# Patient Record
Sex: Female | Born: 1960 | Hispanic: No | Marital: Married | Smoking: Never smoker
Health system: Southern US, Community
[De-identification: ages and names within clinical notes are randomized; demographics above are authoritative.]

## PROBLEM LIST (undated history)

## (undated) DIAGNOSIS — F419 Anxiety disorder, unspecified: Secondary | ICD-10-CM

## (undated) DIAGNOSIS — R112 Nausea with vomiting, unspecified: Secondary | ICD-10-CM

## (undated) DIAGNOSIS — Z9889 Other specified postprocedural states: Secondary | ICD-10-CM

## (undated) DIAGNOSIS — E220 Acromegaly and pituitary gigantism: Secondary | ICD-10-CM

## (undated) DIAGNOSIS — G4733 Obstructive sleep apnea (adult) (pediatric): Secondary | ICD-10-CM

## (undated) DIAGNOSIS — M1990 Unspecified osteoarthritis, unspecified site: Secondary | ICD-10-CM

## (undated) DIAGNOSIS — T8859XA Other complications of anesthesia, initial encounter: Secondary | ICD-10-CM

## (undated) DIAGNOSIS — K219 Gastro-esophageal reflux disease without esophagitis: Secondary | ICD-10-CM

## (undated) DIAGNOSIS — T4145XA Adverse effect of unspecified anesthetic, initial encounter: Secondary | ICD-10-CM

## (undated) HISTORY — DX: Acromegaly and pituitary gigantism: E22.0

## (undated) HISTORY — DX: Obstructive sleep apnea (adult) (pediatric): G47.33

## (undated) HISTORY — PX: OTHER SURGICAL HISTORY: SHX169

---

## 2007-06-08 HISTORY — PX: HEMORRHOID SURGERY: SHX153

## 2008-04-06 HISTORY — PX: OTHER SURGICAL HISTORY: SHX169

## 2010-11-07 HISTORY — PX: OTHER SURGICAL HISTORY: SHX169

## 2010-12-11 ENCOUNTER — Encounter (HOSPITAL_COMMUNITY)
Admission: RE | Admit: 2010-12-11 | Discharge: 2010-12-11 | Disposition: A | Discharge status: Home / Self Care | Payer: Managed Care, Other (non HMO) | Source: Ambulatory Visit | Attending: Orthopedic Surgery | Admitting: Orthopedic Surgery

## 2010-12-11 DIAGNOSIS — Z01812 Encounter for preprocedural laboratory examination: Secondary | ICD-10-CM | POA: Insufficient documentation

## 2010-12-11 LAB — CBC
HCT: 43.4 % (ref 36.0–46.0)
Hemoglobin: 14.6 g/dL (ref 12.0–15.0)
MCH: 32.1 pg (ref 26.0–34.0)
MCHC: 33.6 g/dL (ref 30.0–36.0)
MCV: 95.4 fL (ref 78.0–100.0)
Platelets: 258 10*3/uL (ref 150–400)
RBC: 4.55 MIL/uL (ref 3.87–5.11)
RDW: 13.6 % (ref 11.5–15.5)
WBC: 7.1 10*3/uL (ref 4.0–10.5)

## 2010-12-11 LAB — COMPREHENSIVE METABOLIC PANEL
ALT: 23 U/L (ref 0–35)
AST: 18 U/L (ref 0–37)
Albumin: 4 g/dL (ref 3.5–5.2)
Alkaline Phosphatase: 58 U/L (ref 39–117)
BUN: 9 mg/dL (ref 6–23)
CO2: 32 mEq/L (ref 19–32)
Calcium: 10 mg/dL (ref 8.4–10.5)
Chloride: 103 mEq/L (ref 96–112)
Creatinine, Ser: 0.7 mg/dL (ref 0.4–1.2)
GFR calc Af Amer: >60 mL/min (ref >60)
GFR calc non Af Amer: >60 mL/min (ref >60)
Glucose, Bld: 108 mg/dL — ABNORMAL HIGH (ref 70–99)
Potassium: 4.3 mEq/L (ref 3.5–5.1)
Sodium: 141 mEq/L (ref 135–145)
Total Bilirubin: 0.7 mg/dL (ref 0.3–1.2)
Total Protein: 7 g/dL (ref 6.0–8.3)

## 2010-12-11 LAB — URINALYSIS, ROUTINE W REFLEX MICROSCOPIC
Bilirubin Urine: NEGATIVE
Glucose, UA: NEGATIVE mg/dL
Ketones, ur: NEGATIVE mg/dL
Nitrite: NEGATIVE
Protein, ur: NEGATIVE mg/dL
Specific Gravity, Urine: 1.012 (ref 1.005–1.030)
Urobilinogen, UA: 0.2 mg/dL (ref 0.0–1.0)
pH: 6.5 (ref 5.0–8.0)

## 2010-12-11 LAB — PROTIME-INR
INR: 0.86 (ref 0.00–1.49)
Prothrombin Time: 11.9 seconds (ref 11.6–15.2)

## 2010-12-11 LAB — URINE MICROSCOPIC-ADD ON

## 2010-12-11 LAB — SURGICAL PCR SCREEN
MRSA, PCR: NEGATIVE
Staphylococcus aureus: NEGATIVE

## 2010-12-11 LAB — APTT: aPTT: 26 seconds (ref 24–37)

## 2010-12-13 ENCOUNTER — Observation Stay (HOSPITAL_COMMUNITY)
Admission: RE | Admit: 2010-12-13 | Discharge: 2010-12-14 | Disposition: A | Discharge status: Home / Self Care | Payer: Managed Care, Other (non HMO) | Source: Ambulatory Visit | Attending: Orthopedic Surgery | Admitting: Orthopedic Surgery

## 2010-12-13 DIAGNOSIS — M24119 Other articular cartilage disorders, unspecified shoulder: Secondary | ICD-10-CM | POA: Insufficient documentation

## 2010-12-13 DIAGNOSIS — M719 Bursopathy, unspecified: Secondary | ICD-10-CM | POA: Insufficient documentation

## 2010-12-13 DIAGNOSIS — M19019 Primary osteoarthritis, unspecified shoulder: Secondary | ICD-10-CM | POA: Insufficient documentation

## 2010-12-13 DIAGNOSIS — M25819 Other specified joint disorders, unspecified shoulder: Principal | ICD-10-CM | POA: Insufficient documentation

## 2010-12-13 DIAGNOSIS — M67919 Unspecified disorder of synovium and tendon, unspecified shoulder: Secondary | ICD-10-CM | POA: Insufficient documentation

## 2011-01-07 NOTE — Op Note (Signed)
NAMECHEYAN, [name]        ACCOUNT NO.:  [id number]  MEDICAL RECORD NO.:  [id number]           PATIENT TYPE:  I  LOCATION:  5015                         FACILITY:  [facility]  PHYSICIAN:  Vania Rea. Shelley Pooley, M.D.  DATE OF BIRTH:  [birth year]  DATE OF PROCEDURE:  12/13/2010 DATE OF DISCHARGE:                              OPERATIVE REPORT   PREOPERATIVE DIAGNOSES: 1. Right shoulder impingement syndrome. 2. Right shoulder acromioclavicular joint arthrosis. 3. Early right shoulder glenohumeral arthrosis.  POSTOPERATIVE DIAGNOSES: 1. Right shoulder impingement syndrome. 2. Right shoulder acromioclavicular joint arthrosis. 3. Early right shoulder glenohumeral arthrosis.  PROCEDURE: 1. Right shoulder diagnostic arthroscopy. 2. Chondroplasty of the humeral head. 3. Debridement of degenerative labral tear. 4. Synovectomy of glenohumeral joint. 5. Debridement of partial articular rotator cuff tear. 6. Arthroscopic subacromial decompression and bursectomy. 7. Arthroscopic distal clavicle resection.  SURGEON:  Vania Rea. Marcell Chavarin, MD  ASSISTANT:  Lucita Lora. [place], PA-C  ANESTHESIA:  General endotracheal as well as an interscalene block.  ESTIMATED BLOOD LOSS:  Minimal.  DRAINS:  None.  HISTORY:  [name] is a 50 year old female who has had persistent and progressive increasing right shoulder pain with ongoing functional limitations and symptoms that have been refractory to prolonged attempts at conservative management.  Due to her ongoing pain and functional limitations, she is brought to the operating room at this time for planned right shoulder arthroscopy as described below.  We counseled [name] on treatment options as well as risks versus benefits thereof.  Possible surgical complications were reviewed including the potential for bleeding, infection, neurovascular injury, persistent pain, loss of motion, anesthetic complication, possible need for additional  surgery.  She understands and accepts and agrees with our planned procedure.  PROCEDURE IN DETAIL:  After undergoing routine preop evaluation, the patient received prophylactic antibiotics.  An interscalene block was established in the holding area but the Anesthesia Department.  Placed supine on the operating table, underwent smooth induction of general endotracheal anesthesia.  Turned to left lateral decubitus position on beanbag and appropriately padded and protected.  Right shoulder was then suspended at 70 degrees of abduction with 10 pounds of traction.  The right shoulder girdle region was sterilely prepped and draped in standard fashion.  Time-out was called.  Posterior portal was established at the glenohumeral joint and the anterior portal was established under direct visualization.  The overall capsular volume did not suggest adhesive capsulitis and if anything her shoulder volume was the upper limits of normal.  There was marked degenerative change of the articular cartilage on the humeral head with multiple blistered areas and chondral fissuring and a shaver was introduced and used to perform a chondroplasty of all loose friable cartilage.  There was degenerative tearing of this anterior and superior labrum which was debrided with a shaver to a stable margin.  There was significant synovitis as well and synovectomy was performed and the Stryker wand was then used to obtain hemostasis.  Biceps anchor was stable.  Biceps tendon showed normal caliber.  There was a partial articular rotator cuff tear involving the distal supraspinatus.  This was debrided with a shaver to a stable margin with estimated  accounted for approximately 10% to 15% of the thickness of the tendon.  No additional pathologies were identified. Fluid and instruments were removed.  The arm was dropped down to 30 degrees of abduction.  The arthroscope entered the subacromial space to the posterior portal and  direct lateral portal was established in the subacromial space.  Abundant proliferative bursal tissue was encountered and this was divided and excised with combination shaver and the Stryker wand.  Wand was then used to remove the periosteum from the undersurface of the anterior half of the acromion.  The subacromial depression was performed with a bur creating a type 1 morphology portal.  Portal was then established directly anterior to the distal clavicle and the distal clavicle resection performed with the bur.  Care was taken to make sure the entire circumference of the distal clavicle could be visualized to ensure adequate removal of the bone.  We then completed the subacromial/subdeltoid bursectomy.  The bursal surface and rotator cuff was carefully inspected and found to be intact.  Hemostasis was obtained.  Fluid and instruments were removed.  The portal was closed with Monocryl and Steri-Strips.  Bulky dry dressing taped of the right shoulder.  Right arm was placed in a sling immobilizer.  The patient was awakened, extubated, taken to recovery room in stable condition.     Vania Rea. [place], M.D.     KMS/MEDQ  D:  12/13/2010  T:  12/14/2010  Job:  [id number]  Electronically Signed by Francena Hanly M.D. on 01/07/2011 12:50:40 PM

## 2011-10-08 DIAGNOSIS — E220 Acromegaly and pituitary gigantism: Secondary | ICD-10-CM

## 2011-10-08 HISTORY — PX: PITUITARY SURGERY: SHX203

## 2011-10-08 HISTORY — DX: Acromegaly and pituitary gigantism: E22.0

## 2012-03-05 ENCOUNTER — Other Ambulatory Visit: Payer: Self-pay | Admitting: Sports Medicine

## 2012-03-05 ENCOUNTER — Ambulatory Visit
Admission: RE | Admit: 2012-03-05 | Discharge: 2012-03-05 | Disposition: A | Discharge status: Home / Self Care | Payer: Managed Care, Other (non HMO) | Source: Ambulatory Visit | Attending: Sports Medicine | Admitting: Sports Medicine

## 2012-03-05 DIAGNOSIS — D497 Neoplasm of unspecified behavior of endocrine glands and other parts of nervous system: Secondary | ICD-10-CM

## 2012-03-05 MED ORDER — GADOBENATE DIMEGLUMINE 529 MG/ML IV SOLN
7.0000 mL | Freq: Once | INTRAVENOUS | Status: AC | PRN
  Administered 2012-03-05: 7 mL via INTRAVENOUS

## 2012-03-31 ENCOUNTER — Encounter: Payer: Self-pay | Admitting: Pulmonary Disease

## 2012-04-01 ENCOUNTER — Encounter: Payer: Self-pay | Admitting: Internal Medicine

## 2012-04-01 ENCOUNTER — Ambulatory Visit (INDEPENDENT_AMBULATORY_CARE_PROVIDER_SITE_OTHER): Payer: Managed Care, Other (non HMO) | Admitting: Internal Medicine

## 2012-04-01 VITALS — BP 136/82 | HR 103 | Ht 62.0 in | Wt 169.4 lb

## 2012-04-01 DIAGNOSIS — G4733 Obstructive sleep apnea (adult) (pediatric): Secondary | ICD-10-CM

## 2012-04-01 DIAGNOSIS — D353 Benign neoplasm of craniopharyngeal duct: Secondary | ICD-10-CM

## 2012-04-01 DIAGNOSIS — D352 Benign neoplasm of pituitary gland: Secondary | ICD-10-CM | POA: Insufficient documentation

## 2012-04-01 DIAGNOSIS — E220 Acromegaly and pituitary gigantism: Secondary | ICD-10-CM | POA: Insufficient documentation

## 2012-04-01 NOTE — Assessment & Plan Note (Signed)
Surgery to address her pituitary adenoma is planned through a sphenoid approach with requirement that she be off of CPAP for 2 weeks postoperatively. The primary requirement is that her anesthesiologist, surgeon and the nursing staff be aware of her obstructive sleep apnea, which will be aggravated by sedation and analgesics, her thick tongue and nasal packing. She may require some prolongation of endotracheal intubation, at least until fully awake in recovery.  Recommend: Sleep sitting upright; continuous monitoring for oximetry, respiratory rate and heart rhythm; local pulmonary/sleep medicine consultation may be helpful.  She and her husband have good insight, recognizing this is necessary surgery and that she will inevitably be at increased risk of complications. She is going to return here for outpatient pulmonary/sleep medicine followup postoperatively. We can reassess at that time.

## 2012-04-01 NOTE — Progress Notes (Signed)
04/01/12- 30 yoF never smoker referred by Dr Altheimer for preoperative evaluation. Husband here.  She has a pituitary adenoma with acromegaly. She has been referred for neurosurgery at [facility].  She was diagnosed with obstructive sleep apnea at [facility] based on NPSG 1021 and 11: AHI 41 per hour. She was severe in all positions, worse supine. Body weight was 146 pounds. CPAP titration 08/23/2010: 10 CWP with good control. She has been using CPAP with excellent control and compliance and firm by her husband who says she has not snored through the mask. She has been using CPAP 10/ nasaal pillows/ [employer] [facility]. Typical bedtime 9 PM with short sleep latency, waking 2 or 3 times during the night before finally up between 6:30 and 7:30 AM. Pain in the neck and shoulders has been waking her, resulting in increased daytime sleepiness. Especially over the past year, she has gained 20 pounds and has noted thickening in her tongue, coarsening of features and enlargement of the hands and feet. No history of ENT surgery. She had outpatient surgeries in the last 2 years which did not require her to stay off of CPAP. She denies history of cardiopulmonary disease. Seasonal allergic rhinitis, and in the spring. Nasal congestion, thickening of the tongue and weight gain had not so far required CPAP pressure change.  Prior to Admission medications   Medication Sig Start Date End Date Taking? Authorizing Provider  Ascorbic Acid (VITAMIN C) 1000 MG tablet Take 1,000 mg by mouth daily.   Yes Historical Provider, MD  Cholecalciferol 5000 UNITS capsule Take 5,000 Units by mouth daily.   Yes Historical Provider, MD  Chromium 200 MCG CAPS Take by mouth 2 (two) times daily.   Yes Historical Provider, MD  fish oil-omega-3 fatty acids 1000 MG capsule Take by mouth daily.   Yes Historical Provider, MD  LORazepam (ATIVAN) 1 MG tablet Take 1 mg by mouth. 1-2 times a day   Yes Historical Provider, MD  Lysine 500 MG CAPS Take by mouth. 1-2  times a day   Yes Historical Provider, MD  Magnesium 400 MG CAPS Take by mouth. 1 capsule with a meal once a day   Yes Historical Provider, MD  Milk Thistle 200 MG CAPS Take by mouth 2 (two) times daily.   Yes Historical Provider, MD  pyridOXINE (VITAMIN B-6) 100 MG tablet Take 100 mg by mouth 3 (three) times daily.   Yes Historical Provider, MD  TIZANIDINE HCL PO Take 2 mg by mouth at bedtime as needed.   Yes Historical Provider, MD  vitamin B-12 (CYANOCOBALAMIN) 1000 MCG tablet Take 1,000 mcg by mouth daily.   Yes Historical Provider, MD  zolpidem (AMBIEN) 10 MG tablet Take 10 mg by mouth at bedtime as needed.   Yes Historical Provider, MD   Past Medical History  Diagnosis Date  . Degenerative disc disease   . OSA (obstructive sleep apnea)   . 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 Degenerative disk disease    Past Surgical History  Procedure Date  . Elbow ligament surgery right remote   . Bladder suspension, unspecified   . Hemorrhoid surgery 06/2007  . Appendectomy 04/2008  . Burnionectomy, bilateral   . Rotator cuff repair right shoulder 11/2010  . Nerve decompression right c5 and nerve ablation c5 and c6    Family History  Problem Relation Age of Onset  . Heart disease Father   . Coronary artery disease Father   . Heart disease Mother   .  Coronary artery disease Mother   . Sleep apnea Sister    History   Social History  . Marital Status: Married    Spouse Name: N/A    Number of Children: 1  . Years of Education: N/A   Occupational History  . Not on file.   Social History Main Topics  . Smoking status: Never Smoker   . Smokeless tobacco: Not on file  . Alcohol Use: Yes     socially  . Drug Use: No  . Sexually Active: Not on file   Other Topics Concern  . Not on file   Social History Narrative  . No narrative on file   ROS-see HPI Constitutional:   No-   weight loss, night sweats, fevers, chills, fatigue, lassitude. HEENT:   + headaches, No-difficulty  swallowing, tooth/dental problems, sore throat,       No-  sneezing, itching, ear ache, nasal congestion, post nasal drip, ears feel stopped up CV:  No-   chest pain, orthopnea, PND, swelling in lower extremities, anasarca, dizziness, palpitations Resp: No-   shortness of breath with exertion or at rest.              No-   productive cough,  No non-productive cough,  No- coughing up of blood.              No-   change in color of mucus.  No- wheezing.   Skin: No-   rash or lesions. GI:  No-   heartburn, indigestion, abdominal pain, nausea, vomiting, diarrhea,                 change in bowel habits, loss of appetite GU: No-   dysuria, change in color of urine, no urgency or frequency.  No- flank pain. MS:  +  joint pain or swelling.  No- decreased range of motion.  + back pain. Neuro-     nothing unusual Psych:  No- change in mood or affect. + Situational depression or anxiety.  No memory loss.  OBJ- Physical Exam BP 136/82  Pulse 103  Ht 5\' 2"  (1.575 m)  Wt 169 lb 6.4 oz (76.839 kg)  BMI 30.98 kg/m2  SpO2 100%  General- Alert, Oriented, Affect-appropriate, Distress- none acute, coarse features with large hands and feet Skin- rash-none, lesions- none, excoriation- none Lymphadenopathy- none Head- atraumatic            Eyes- Gross vision intact, PERRLA, conjunctivae and secretions clear, bags under her eyes            Ears- Hearing, canals-filled with cerumen bilaterally            Nose- Clear, no-Septal dev, mucus, polyps, erosion, perforation             Throat- Mallampati III , mucosa clear , drainage- none, tonsils- atrophic Neck- flexible , trachea midline, no stridor , thyroid nl, carotid no bruit Chest - symmetrical excursion , unlabored           Heart/CV- RRR , no murmur , no gallop  , no rub, nl s1 s2                           - JVD- none , edema- none, stasis changes- none, varices- none           Lung- clear to P&A, wheeze- none, cough- none , dullness-none, rub- none  Chest wall-  Abd- tender-no, distended-no, bowel sounds-present, HSM- no Br/ Gen/ Rectal- Not done, not indicated Extrem- cyanosis- none, clubbing, none, atrophy- none, strength- nl Neuro- grossly intact to observation

## 2012-04-01 NOTE — Patient Instructions (Addendum)
I will have office note ready for you to pick up tomorrow

## 2012-04-29 DIAGNOSIS — I10 Essential (primary) hypertension: Secondary | ICD-10-CM | POA: Insufficient documentation

## 2012-04-29 DIAGNOSIS — E236 Other disorders of pituitary gland: Secondary | ICD-10-CM | POA: Insufficient documentation

## 2012-05-05 ENCOUNTER — Institutional Professional Consult (permissible substitution): Payer: Managed Care, Other (non HMO) | Admitting: Pulmonary Disease

## 2012-05-07 ENCOUNTER — Inpatient Hospital Stay (HOSPITAL_COMMUNITY)
Admission: EM | Admit: 2012-05-07 | Discharge: 2012-05-09 | DRG: 644 | Disposition: A | Discharge status: Home / Self Care | Payer: Managed Care, Other (non HMO) | Attending: Internal Medicine | Admitting: Internal Medicine

## 2012-05-07 ENCOUNTER — Encounter (HOSPITAL_COMMUNITY): Payer: Self-pay | Admitting: Emergency Medicine

## 2012-05-07 DIAGNOSIS — R5381 Other malaise: Secondary | ICD-10-CM

## 2012-05-07 DIAGNOSIS — E236 Other disorders of pituitary gland: Principal | ICD-10-CM | POA: Diagnosis present

## 2012-05-07 DIAGNOSIS — Z79899 Other long term (current) drug therapy: Secondary | ICD-10-CM

## 2012-05-07 DIAGNOSIS — R531 Weakness: Secondary | ICD-10-CM

## 2012-05-07 DIAGNOSIS — D352 Benign neoplasm of pituitary gland: Secondary | ICD-10-CM | POA: Diagnosis present

## 2012-05-07 DIAGNOSIS — E860 Dehydration: Secondary | ICD-10-CM | POA: Diagnosis present

## 2012-05-07 DIAGNOSIS — E871 Hypo-osmolality and hyponatremia: Secondary | ICD-10-CM | POA: Diagnosis present

## 2012-05-07 DIAGNOSIS — R5383 Other fatigue: Secondary | ICD-10-CM

## 2012-05-07 DIAGNOSIS — N390 Urinary tract infection, site not specified: Secondary | ICD-10-CM | POA: Diagnosis present

## 2012-05-07 DIAGNOSIS — E220 Acromegaly and pituitary gigantism: Secondary | ICD-10-CM

## 2012-05-07 DIAGNOSIS — G4733 Obstructive sleep apnea (adult) (pediatric): Secondary | ICD-10-CM | POA: Diagnosis present

## 2012-05-07 DIAGNOSIS — R112 Nausea with vomiting, unspecified: Secondary | ICD-10-CM | POA: Diagnosis present

## 2012-05-07 LAB — CBC WITH DIFFERENTIAL/PLATELET
Basophils Absolute: 0 10*3/uL (ref 0.0–0.1)
Basophils Relative: 0 % (ref 0–1)
Eosinophils Absolute: 0.2 10*3/uL (ref 0.0–0.7)
Eosinophils Relative: 2 % (ref 0–5)
HCT: 38.3 % (ref 36.0–46.0)
Hemoglobin: 13.4 g/dL (ref 12.0–15.0)
Lymphocytes Relative: 33 % (ref 12–46)
Lymphs Abs: 2.9 10*3/uL (ref 0.7–4.0)
MCH: 31.7 pg (ref 26.0–34.0)
MCHC: 35 g/dL (ref 30.0–36.0)
MCV: 90.5 fL (ref 78.0–100.0)
Monocytes Absolute: 0.5 10*3/uL (ref 0.1–1.0)
Monocytes Relative: 5 % (ref 3–12)
Neutro Abs: 5.2 10*3/uL (ref 1.7–7.7)
Neutrophils Relative %: 60 % (ref 43–77)
Platelets: 338 10*3/uL (ref 150–400)
RBC: 4.23 MIL/uL (ref 3.87–5.11)
RDW: 12.9 % (ref 11.5–15.5)
WBC: 8.7 10*3/uL (ref 4.0–10.5)

## 2012-05-07 LAB — URINALYSIS, ROUTINE W REFLEX MICROSCOPIC
Bilirubin Urine: NEGATIVE
Glucose, UA: NEGATIVE mg/dL
Ketones, ur: NEGATIVE mg/dL
Nitrite: NEGATIVE
Protein, ur: NEGATIVE mg/dL
Specific Gravity, Urine: 1.007 (ref 1.005–1.030)
Urobilinogen, UA: 0.2 mg/dL (ref 0.0–1.0)
pH: 7.5 (ref 5.0–8.0)

## 2012-05-07 LAB — BASIC METABOLIC PANEL
BUN: 14 mg/dL (ref 6–23)
CO2: 28 mEq/L (ref 19–32)
Calcium: 9.3 mg/dL (ref 8.4–10.5)
Chloride: 86 mEq/L — ABNORMAL LOW (ref 96–112)
Creatinine, Ser: 0.59 mg/dL (ref 0.50–1.10)
GFR calc Af Amer: >90 mL/min (ref >90)
GFR calc non Af Amer: >90 mL/min (ref >90)
Glucose, Bld: 107 mg/dL — ABNORMAL HIGH (ref 70–99)
Potassium: 3.7 mEq/L (ref 3.5–5.1)
Sodium: 125 mEq/L — ABNORMAL LOW (ref 135–145)

## 2012-05-07 LAB — PRO B NATRIURETIC PEPTIDE: Pro B Natriuretic peptide (BNP): 86.1 pg/mL (ref 0–125)

## 2012-05-07 LAB — URINE MICROSCOPIC-ADD ON

## 2012-05-07 LAB — PHOSPHORUS: Phosphorus: 3.5 mg/dL (ref 2.3–4.6)

## 2012-05-07 LAB — MAGNESIUM: Magnesium: 2.1 mg/dL (ref 1.5–2.5)

## 2012-05-07 MED ORDER — SODIUM CHLORIDE 0.9 % IV SOLN: 250.0000 mL | INTRAVENOUS | Status: DC | PRN

## 2012-05-07 MED ORDER — HYDROCORTISONE SOD SUCCINATE 100 MG IJ SOLR
100.0000 mg | Freq: Once | INTRAMUSCULAR | Status: AC
  Administered 2012-05-07: 100 mg via INTRAVENOUS
  Filled 2012-05-07: qty 2

## 2012-05-07 MED ORDER — ONDANSETRON HCL 4 MG PO TABS: 4.0000 mg | ORAL_TABLET | Freq: Four times a day (QID) | ORAL | Status: DC | PRN

## 2012-05-07 MED ORDER — ONDANSETRON HCL 4 MG/2ML IJ SOLN: 4.0000 mg | Freq: Four times a day (QID) | INTRAMUSCULAR | Status: DC | PRN

## 2012-05-07 MED ORDER — TIZANIDINE HCL 2 MG PO TABS
2.0000 mg | ORAL_TABLET | Freq: Every day | ORAL | Status: DC
  Filled 2012-05-07 (×3): qty 1

## 2012-05-07 MED ORDER — ACETAMINOPHEN 650 MG RE SUPP: 650.0000 mg | Freq: Four times a day (QID) | RECTAL | Status: DC | PRN

## 2012-05-07 MED ORDER — SODIUM CHLORIDE 0.9 % IJ SOLN
3.0000 mL | Freq: Two times a day (BID) | INTRAMUSCULAR | Status: DC
  Administered 2012-05-07: 3 mL via INTRAVENOUS

## 2012-05-07 MED ORDER — SODIUM CHLORIDE 0.9 % IV BOLUS (SEPSIS)
1000.0000 mL | Freq: Once | INTRAVENOUS | Status: AC
  Administered 2012-05-07: 1000 mL via INTRAVENOUS

## 2012-05-07 MED ORDER — ONDANSETRON HCL 4 MG/2ML IJ SOLN
4.0000 mg | Freq: Once | INTRAMUSCULAR | Status: AC
  Administered 2012-05-07: 4 mg via INTRAVENOUS
  Filled 2012-05-07: qty 2

## 2012-05-07 MED ORDER — LORAZEPAM 1 MG PO TABS: 1.0000 mg | ORAL_TABLET | Freq: Two times a day (BID) | ORAL | Status: DC | PRN

## 2012-05-07 MED ORDER — DEXTROSE 5 % IV SOLN
1.0000 g | Freq: Once | INTRAVENOUS | Status: AC
  Administered 2012-05-07: 1 g via INTRAVENOUS
  Filled 2012-05-07: qty 10

## 2012-05-07 MED ORDER — ACETAMINOPHEN 325 MG PO TABS: 650.0000 mg | ORAL_TABLET | Freq: Four times a day (QID) | ORAL | Status: DC | PRN

## 2012-05-07 MED ORDER — SODIUM CHLORIDE 0.9 % IJ SOLN: 3.0000 mL | INTRAMUSCULAR | Status: DC | PRN

## 2012-05-07 MED ORDER — ZOLPIDEM TARTRATE 5 MG PO TABS
10.0000 mg | ORAL_TABLET | Freq: Every day | ORAL | Status: DC
  Administered 2012-05-07 – 2012-05-08 (×2): 10 mg via ORAL
  Filled 2012-05-07 (×2): qty 2

## 2012-05-07 MED ORDER — SODIUM CHLORIDE 0.9 % IJ SOLN: 3.0000 mL | Freq: Two times a day (BID) | INTRAMUSCULAR | Status: DC

## 2012-05-07 NOTE — ED Notes (Signed)
REPORT GIVEN TO FLOOR NURSE , TRANSPORTED TO FLOOR IN STABLE CONDITION . DENIES PAIN OR NAUSEA AT TIME OF TRANSPORT.

## 2012-05-07 NOTE — ED Notes (Signed)
Pt c/o generalized weakness and N/V; pt sts recent pituitary tumor removal; pt told to come to check sodium

## 2012-05-07 NOTE — ED Notes (Signed)
S/p pituitary gland tumor removal 7 days ago. C/o generalized weakness, nausea, emesis x1, since yesterday. Today c/o h/a posterior head, states has not experienced any h/a's since surgery. Denies abd pain, diarrhea. Decreased appetite & oral intake due to nausea.

## 2012-05-07 NOTE — Progress Notes (Signed)
Pt is put in her bed. Pt is comfortable and in no apparent distress.Pt was oriented to the unit and her room. Safety Video was viewed.

## 2012-05-07 NOTE — ED Notes (Signed)
PT. WAITING FOR ADMITTING MD.  

## 2012-05-07 NOTE — ED Provider Notes (Signed)
History     CSN: [id number]  Arrival date & time 05/07/12  1556   First MD Initiated Contact with Patient 05/07/12 1757      Chief Complaint  Patient presents with  . Emesis  . Fatigue    (Consider location/radiation/quality/duration/timing/severity/associated sxs/prior treatment) HPI Pt with pituitary adenoma removed several days ago at [facility] present with generalized weakness, nausea and mild HA. Decreased urinary output today though she states she has been drinking less. No fever, chills, SOB, CP, focal weakness, numbness or visual changes. No abd pain V/D.  Past Medical History  Diagnosis Date  . Degenerative disc disease   . OSA (obstructive sleep apnea)   . 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 Degenerative disk disease     Past Surgical History  Procedure Date  . Elbow ligament surgery right remote   . Bladder suspension, unspecified   . Hemorrhoid surgery 06/2007  . Appendectomy 04/2008  . Burnionectomy, bilateral   . Rotator cuff repair right shoulder 11/2010  . Nerve decompression right c5 and nerve ablation c5 and c6     Family History  Problem Relation Age of Onset  . Heart disease Father   . Coronary artery disease Father   . Heart disease Mother   . Coronary artery disease Mother   . Sleep apnea Sister     History  Substance Use Topics  . Smoking status: Never Smoker   . Smokeless tobacco: Not on file  . Alcohol Use: Yes     socially    OB History    Grav Para Term Preterm Abortions TAB SAB Ect Mult Living                  Review of Systems  Constitutional: Positive for fatigue. Negative for fever and chills.  HENT: Negative for neck pain and neck stiffness.   Eyes: Negative for visual disturbance.  Respiratory: Negative for cough and shortness of breath.   Cardiovascular: Negative for chest pain, palpitations and leg swelling.  Gastrointestinal: Positive for nausea. Negative for vomiting, abdominal pain, diarrhea and constipation.    Genitourinary: Negative for dysuria, frequency and flank pain.  Musculoskeletal: Negative for back pain.  Skin: Negative for rash and wound.  Neurological: Positive for headaches. Negative for dizziness, tremors, seizures, weakness, light-headedness and numbness.    Allergies  Epinephrine and Codeine  Home Medications   Current Outpatient Rx  Name Route Sig Dispense Refill  . LORAZEPAM 1 MG PO TABS Oral Take 1 mg by mouth 2 (two) times daily as needed. For anxiety    . TIZANIDINE HCL 2 MG PO CAPS Oral Take 2 mg by mouth at bedtime.    Marland Kitchen ZOLPIDEM TARTRATE 10 MG PO TABS Oral Take 10 mg by mouth at bedtime.       BP 152/80  Pulse 69  Temp 97.8 F (36.6 C) (Oral)  Resp 16  SpO2 99%  Physical Exam  Nursing note and vitals reviewed. Constitutional: She is oriented to person, place, and time. She appears well-developed and well-nourished. No distress.  HENT:  Head: Normocephalic and atraumatic.  Mouth/Throat: Oropharynx is clear and moist.  Eyes: EOM are normal. Pupils are equal, round, and reactive to light.  Neck: Normal range of motion. Neck supple.       No meningismus   Cardiovascular: Normal rate and regular rhythm.   Pulmonary/Chest: Effort normal and breath sounds normal. No respiratory distress. She has no wheezes. She has no rales.  Abdominal: Soft.  Bowel sounds are normal. There is no tenderness. There is no rebound and no guarding.  Musculoskeletal: Normal range of motion. She exhibits no edema and no tenderness.  Neurological: She is alert and oriented to person, place, and time.       5/5 motor, sensation intact  Skin: Skin is warm and dry. No rash noted. No erythema.  Psychiatric: She has a normal mood and affect. Her behavior is normal.    ED Course  Procedures (including critical care time)  Labs Reviewed  BASIC METABOLIC PANEL - Abnormal; Notable for the following:    Sodium 125 (*)     Chloride 86 (*)     Glucose, Bld 107 (*)     All other components  within normal limits  URINALYSIS, ROUTINE W REFLEX MICROSCOPIC - Abnormal; Notable for the following:    APPearance HAZY (*)     Hgb urine dipstick TRACE (*)     Leukocytes, UA LARGE (*)     All other components within normal limits  URINE MICROSCOPIC-ADD ON - Abnormal; Notable for the following:    Bacteria, UA FEW (*)     All other components within normal limits  CBC WITH DIFFERENTIAL  CORTISOL  URINE CULTURE   No results found.   1. Hyponatremia   2. Generalized weakness       MDM  Discussed with Dr Roseanne Reno, Endocrine fellow at [facility] [phone]). 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 Advised observation overnight and repeat electrolytes in the morning. Free water restriction to 750 ml.   Pt states she is feeling much better. Discussed with [facility] and they will admit the pt.         Loren Racer, MD 05/07/12 [phone]

## 2012-05-08 LAB — BASIC METABOLIC PANEL
BUN: 11 mg/dL (ref 6–23)
CO2: 27 mEq/L (ref 19–32)
Calcium: 9 mg/dL (ref 8.4–10.5)
Chloride: 88 mEq/L — ABNORMAL LOW (ref 96–112)
Creatinine, Ser: 0.59 mg/dL (ref 0.50–1.10)
GFR calc Af Amer: >90 mL/min (ref >90)
GFR calc non Af Amer: >90 mL/min (ref >90)
Glucose, Bld: 94 mg/dL (ref 70–99)
Potassium: 3.9 mEq/L (ref 3.5–5.1)
Sodium: 124 mEq/L — ABNORMAL LOW (ref 135–145)

## 2012-05-08 LAB — URINE CULTURE
Colony Count: NO GROWTH
Culture: NO GROWTH

## 2012-05-08 LAB — TSH: TSH: 0.353 u[IU]/mL (ref 0.350–4.500)

## 2012-05-08 LAB — CORTISOL: Cortisol, Plasma: 17.4 ug/dL

## 2012-05-08 MED ORDER — SALINE SPRAY 0.65 % NA SOLN
1.0000 | NASAL | Status: DC | PRN
  Administered 2012-05-08: 1 via NASAL
  Filled 2012-05-08: qty 44

## 2012-05-08 MED ORDER — SODIUM CHLORIDE 0.9 % IV SOLN
INTRAVENOUS | Status: DC
  Administered 2012-05-08: 19:00:00 via INTRAVENOUS

## 2012-05-08 MED ORDER — DEXTROSE 5 % IV SOLN
1.0000 g | INTRAVENOUS | Status: DC
  Administered 2012-05-08: 1 g via INTRAVENOUS
  Filled 2012-05-08 (×2): qty 10

## 2012-05-08 NOTE — Progress Notes (Signed)
Patient ID: [name], female   DOB: 1961-09-20, 51 y.o.   MRN: [id number]  [facility] PROGRESS NOTE  Sumedha Munnerlyn Harriss WGN:[id number] DOB: [birth year] DOA: 05/07/2012 PCP: Provider Not In System  Brief narrative: Pt admitted 08/02 with nausea and vomiting, found to have hyponatremia.   Active Problems:  Hyponatremia - unclear what the etiology is at this time - will continue IVF for now - BMP in AM   Pituitary Adenoma - treated in [place] - will obtain records   Dehydration - secondary to vomiting - will continue IVF   Nausea & vomiting - will need to provide IVF, antiemetics as needed - diet was advance this AM and pt tolerating well   UTI (lower urinary tract infection) - continue Rocephin and plan on transitioning to PO  Consultants:  None  Antibiotics:  Rocephin 05/08/2012 -->  Code Status: Full Family Communication: Pt at bedside Disposition Plan: Home when medically stable  HPI/Subjective: No events overnight.   Objective: Filed Vitals:   05/07/12 1748 05/07/12 1830 05/07/12 2313 05/08/12 0546  BP: 138/72 152/80 137/81 129/68  Pulse: 63 69 68 70  Temp: 97.8 F (36.6 C)  98 F (36.7 C) 98.1 F (36.7 C)  TempSrc: Oral  Oral Oral  Resp: 19 16 16 16   Height:   5\' 2"  (1.575 m)   Weight:   169 lb (76.658 kg)   SpO2: 100% 99% 96% 96%    Intake/Output Summary (Last 24 hours) at 05/08/12 1424 Last data filed at 05/08/12 0800  Gross per 24 hour  Intake   1480 ml  Output      0 ml  Net   1480 ml    Exam:   General:  Pt is alert, follows commands appropriately, not in acute distress  Cardiovascular: Regular rate and rhythm, S1/S2, no murmurs, no rubs, no gallops  Respiratory: Clear to auscultation bilaterally, no wheezing, no crackles, no rhonchi  Abdomen: Soft, non tender, non distended, bowel sounds present, no guarding  Extremities: No edema, pulses DP and PT palpable bilaterally  Neuro: Grossly nonfocal  Data  Reviewed: Basic Metabolic Panel:  Lab 05/08/12 7846 05/07/12 2258 05/07/12 1609  NA 124* -- 125*  K 3.9 -- 3.7  CL 88* -- 86*  CO2 27 -- 28  GLUCOSE 94 -- 107*  BUN 11 -- 14  CREATININE 0.59 -- 0.59  CALCIUM 9.0 -- 9.3  MG -- 2.1 --  PHOS -- 3.5 --   CBC:  Lab 05/07/12 1609  WBC 8.7  NEUTROABS 5.2  HGB 13.4  HCT 38.3  MCV 90.5  PLT 338   Scheduled Meds:   . cefTRIAXone (ROCEPHIN)  IV  1 g Intravenous Once  . cefTRIAXone (ROCEPHIN)  IV  1 g Intravenous Q24H  . hydrocortisone sodium succinate  100 mg Intravenous Once  . ondansetron  4 mg Intravenous Once  . sodium chloride  1,000 mL Intravenous Once  . sodium chloride  3 mL Intravenous Q12H  . tiZANidine  2 mg Oral QHS  . zolpidem  10 mg Oral QHS  . DISCONTD: sodium chloride  3 mL Intravenous Q12H   Continuous Infusions:   . sodium chloride       Debbora Presto, MD  [facility] Pager [phone]  If 7PM-7AM, please contact night-coverage [email or web address] Password [email or web address] 05/08/2012, 2:24 PM   LOS: 1 day

## 2012-05-08 NOTE — Care Management Note (Unsigned)
    Page 1 of 1   05/08/2012     4:40:25 PM   CARE MANAGEMENT NOTE 05/08/2012  Patient:  [name], [name]   Account Number:  [id number]  Date Initiated:  05/08/2012  Documentation initiated by:  Letha Cape  Subjective/Objective Assessment:   dx hyponatremia  admit- lives with spouse.  pta independent.     Action/Plan:   Anticipated DC Date:  05/11/2012   Anticipated DC Plan:  HOME/SELF CARE      DC Planning Services  CM consult      Choice offered to / List presented to:             Status of service:  In process, will continue to follow Medicare Important Message given?   (If response is "NO", the following Medicare IM given date fields will be blank) Date Medicare IM given:   Date Additional Medicare IM given:    Discharge Disposition:    Per UR Regulation:  Reviewed for med. necessity/level of care/duration of stay  If discussed at Long Length of Stay Meetings, dates discussed:    Comments:  05/09/12 16:36 Letha Cape RN, BSN [phone] patient lives with spouse, pta independent.  Patient has medication coverage and transportation.  NCM will continue to follow for dc needs.

## 2012-05-08 NOTE — H&P (Signed)
[name] is an 51 y.o. female.   Chief Complaint: Nausea vomiting HPI: A 51 year old female with known history of pituitary adenoma who recently had surgery for removal of adenoma over at [facility] [place] last week. She is here with persistent nausea and vomiting as well as generalized weakness. Patient was also noted to have very low sodium and possibly UTI. Per patient she was doing fine after leaving the hospital but this started feeling weak and nauseated yesterday. She started having vomiting today and has vomited quite a number of times. Denied any fever no headaches no diarrhea. No abdominal pain no constipation and no melena or bright red blood per rectum. She has not been able to keep anything down today. She was seen and evaluated in the ER and her surgeons were contacted in [facility] [place]. Though recommended admitting the patient overnight for observation and management of her hyponatremia.  Past Medical History  Diagnosis Date  . Degenerative disc disease   . OSA (obstructive sleep apnea)   . 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 Degenerative disk disease     Past Surgical History  Procedure Date  . Elbow ligament surgery right remote   . Bladder suspension, unspecified   . Hemorrhoid surgery 06/2007  . Appendectomy 04/2008  . Burnionectomy, bilateral   . Rotator cuff repair right shoulder 11/2010  . Nerve decompression right c5 and nerve ablation c5 and c6     Family History  Problem Relation Age of Onset  . Heart disease Father   . Coronary artery disease Father   . Heart disease Mother   . Coronary artery disease Mother   . Sleep apnea Sister    Social History:  reports that she has never smoked. She does not have any smokeless tobacco history on file. She reports that she drinks alcohol. She reports that she does not use illicit drugs.  Allergies:  Allergies  Allergen Reactions  . Epinephrine Shortness Of Breath  . Codeine     Nausea     Medications Prior to Admission  Medication Sig Dispense Refill  . LORazepam (ATIVAN) 1 MG tablet Take 1 mg by mouth 2 (two) times daily as needed. For anxiety      . tizanidine (ZANAFLEX) 2 MG capsule Take 2 mg by mouth at bedtime.      Marland Kitchen zolpidem (AMBIEN) 10 MG tablet Take 10 mg by mouth at bedtime.         Results for orders placed during the hospital encounter of 05/07/12 (from the past 48 hour(s))  CBC WITH DIFFERENTIAL     Status: Normal   Collection Time   05/07/12  4:09 PM      Component Value Range Comment   WBC 8.7  4.0 - 10.5 K/uL    RBC 4.23  3.87 - 5.11 MIL/uL    Hemoglobin 13.4  12.0 - 15.0 g/dL    HCT 16.1  09.6 - 04.5 %    MCV 90.5  78.0 - 100.0 fL    MCH 31.7  26.0 - 34.0 pg    MCHC 35.0  30.0 - 36.0 g/dL    RDW 40.9  81.1 - 91.4 %    Platelets 338  150 - 400 K/uL    Neutrophils Relative 60  43 - 77 %    Neutro Abs 5.2  1.7 - 7.7 K/uL    Lymphocytes Relative 33  12 - 46 %    Lymphs Abs 2.9  0.7 -  4.0 K/uL    Monocytes Relative 5  3 - 12 %    Monocytes Absolute 0.5  0.1 - 1.0 K/uL    Eosinophils Relative 2  0 - 5 %    Eosinophils Absolute 0.2  0.0 - 0.7 K/uL    Basophils Relative 0  0 - 1 %    Basophils Absolute 0.0  0.0 - 0.1 K/uL   BASIC METABOLIC PANEL     Status: Abnormal   Collection Time   05/07/12  4:09 PM      Component Value Range Comment   Sodium 125 (*) 135 - 145 mEq/L    Potassium 3.7  3.5 - 5.1 mEq/L    Chloride 86 (*) 96 - 112 mEq/L    CO2 28  19 - 32 mEq/L    Glucose, Bld 107 (*) 70 - 99 mg/dL    BUN 14  6 - 23 mg/dL    Creatinine, Ser 9.81  0.50 - 1.10 mg/dL    Calcium 9.3  8.4 - 19.1 mg/dL    GFR calc non Af Amer >90  >90 mL/min    GFR calc Af Amer >90  >90 mL/min   CORTISOL     Status: Normal   Collection Time   05/07/12  6:59 PM      Component Value Range Comment   Cortisol, Plasma 17.4     URINALYSIS, ROUTINE W REFLEX MICROSCOPIC     Status: Abnormal   Collection Time   05/07/12  8:09 PM      Component Value Range Comment   Color,  Urine YELLOW  YELLOW    APPearance HAZY (*) CLEAR    Specific Gravity, Urine 1.007  1.005 - 1.030    pH 7.5  5.0 - 8.0    Glucose, UA NEGATIVE  NEGATIVE mg/dL    Hgb urine dipstick TRACE (*) NEGATIVE    Bilirubin Urine NEGATIVE  NEGATIVE    Ketones, ur NEGATIVE  NEGATIVE mg/dL    Protein, ur NEGATIVE  NEGATIVE mg/dL    Urobilinogen, UA 0.2  0.0 - 1.0 mg/dL    Nitrite NEGATIVE  NEGATIVE    Leukocytes, UA LARGE (*) NEGATIVE   URINE MICROSCOPIC-ADD ON     Status: Abnormal   Collection Time   05/07/12  8:09 PM      Component Value Range Comment   Squamous Epithelial / LPF RARE  RARE    WBC, UA 21-50  <3 WBC/hpf SOME IN CLUMPS   RBC / HPF 0-2  <3 RBC/hpf    Bacteria, UA FEW (*) RARE    Urine-Other RARE YEAST     MAGNESIUM     Status: Normal   Collection Time   05/07/12 10:58 PM      Component Value Range Comment   Magnesium 2.1  1.5 - 2.5 mg/dL   PHOSPHORUS     Status: Normal   Collection Time   05/07/12 10:58 PM      Component Value Range Comment   Phosphorus 3.5  2.3 - 4.6 mg/dL   PRO B NATRIURETIC PEPTIDE     Status: Normal   Collection Time   05/07/12 10:58 PM      Component Value Range Comment   Pro B Natriuretic peptide (BNP) 86.1  0 - 125 pg/mL    No results found.  Review of Systems  HENT: Negative.   Respiratory: Negative.   Cardiovascular: Negative.   Gastrointestinal: Positive for nausea and vomiting. Negative for diarrhea, constipation, blood in  stool and melena.  Genitourinary: Negative.   Musculoskeletal: Positive for myalgias.  Skin: Negative.   Neurological: Positive for dizziness and weakness.  Endo/Heme/Allergies: Negative.   Psychiatric/Behavioral: Negative.     Blood pressure 129/68, pulse 70, temperature 98.1 F (36.7 C), temperature source Oral, resp. rate 16, height 5\' 2"  (1.575 m), weight 76.658 kg (169 lb), SpO2 96.00%. Physical Exam  Constitutional: She is oriented to person, place, and time. She appears well-developed and well-nourished.  HENT:    Head: Normocephalic and atraumatic.  Right Ear: External ear normal.  Left Ear: External ear normal.  Nose: Nose normal.  Mouth/Throat: Oropharynx is clear and moist.  Eyes: Conjunctivae and EOM are normal. Pupils are equal, round, and reactive to light.  Neck: Normal range of motion. Neck supple.  Cardiovascular: Normal rate, regular rhythm, normal heart sounds and intact distal pulses.   Respiratory: Effort normal and breath sounds normal.  GI: Soft. Bowel sounds are normal.  Musculoskeletal: Normal range of motion.  Neurological: She is alert and oriented to person, place, and time. She has normal reflexes.  Skin: Skin is warm and dry.  Psychiatric: She has a normal mood and affect. Her behavior is normal. Judgment and thought content normal.     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 Other possibilities is salt wasting which is more common with hypovolemia. She is slightly dehydrated with low chloride as well. She also had no nausea vomiting which may reflect that mild dehydration. Possibility of UTI also inducing the nausea and vomiting also needs to be considered   Plan #1 nausea and vomiting: This seems to have resolved now. There is no evidence of intracranial pressure increase. More than likely is a reaction to her service and surgery versus UTI. We will treat the UTI as well as symptomatic control of her nausea vomiting. At this point this has stopped.  #2 hyponatremia: Patient admitted for observation. We will fluid restrict her. Work her up for possible SIADH if she is not improving. We will get a little bit of saline also due to her nausea vomiting which may have cause some mild dehydration. Further followup will be with son neurosurgeons once she is stable. #3 UTI: She would empirically be treated while awaiting urine culture.  #4 mild dehydration: Again this could be secondary to her  nausea vomiting. We'll give her a little bit of saline. Marland KitchenGARBA,LAWAL 05/08/2012, 5:52 AM

## 2012-05-08 NOTE — Progress Notes (Signed)
Utilization review complete 

## 2012-05-09 LAB — BASIC METABOLIC PANEL
BUN: 12 mg/dL (ref 6–23)
CO2: 25 mEq/L (ref 19–32)
Calcium: 9.1 mg/dL (ref 8.4–10.5)
Chloride: 88 mEq/L — ABNORMAL LOW (ref 96–112)
Creatinine, Ser: 0.65 mg/dL (ref 0.50–1.10)
GFR calc Af Amer: >90 mL/min (ref >90)
GFR calc non Af Amer: >90 mL/min (ref >90)
Glucose, Bld: 93 mg/dL (ref 70–99)
Potassium: 4 mEq/L (ref 3.5–5.1)
Sodium: 123 mEq/L — ABNORMAL LOW (ref 135–145)

## 2012-05-09 LAB — CBC
HCT: 37.8 % (ref 36.0–46.0)
Hemoglobin: 13.1 g/dL (ref 12.0–15.0)
MCH: 31.4 pg (ref 26.0–34.0)
MCHC: 34.7 g/dL (ref 30.0–36.0)
MCV: 90.6 fL (ref 78.0–100.0)
Platelets: 334 10*3/uL (ref 150–400)
RBC: 4.17 MIL/uL (ref 3.87–5.11)
RDW: 13.1 % (ref 11.5–15.5)
WBC: 9.3 10*3/uL (ref 4.0–10.5)

## 2012-05-09 MED ORDER — ONDANSETRON HCL 4 MG PO TABS: 4.0000 mg | ORAL_TABLET | Freq: Four times a day (QID) | ORAL | Status: AC | PRN

## 2012-05-09 MED ORDER — SALINE SPRAY 0.65 % NA SOLN: 1.0000 | NASAL | Status: DC | PRN

## 2012-05-09 MED ORDER — LORAZEPAM 1 MG PO TABS: 1.0000 mg | ORAL_TABLET | Freq: Two times a day (BID) | ORAL | Status: DC | PRN

## 2012-05-09 NOTE — Discharge Summary (Signed)
Physician Discharge Summary  [name] WUJ:[id number] DOB: [birth year] DOA: 05/07/2012  PCP: Provider Not In System  Admit date: 05/07/2012 Discharge date: 05/09/2012  Recommendations for Outpatient Follow-up:  1. Pt will need to follow up with PCP in 1 week post discharge 2. Please obtain BMP to evaluate sodium level and please contact pt's endocrinologist to let now about Na level 3. Please note that ot is restricted to drink no more than 500 cc of fluids per day 4. 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 She is here with persistent nausea and vomiting as well as generalized weakness. Patient was also noted to have very low sodium and possibly UTI. Per patient she was doing fine after leaving the hospital but this started feeling weak and nauseated yesterday. She started having vomiting the day of admission. Denied any fever, no headaches, no diarrhea. No abdominal pain, no constipation, and no melena or bright red blood per rectum. She has not been able to keep anything down.  Hospital Course:  Active Problems:  Pituitary adenoma - pt was advised to follow with doctor at [facility] [place]   Hyponatremia - this is secondary to SIAD - restricted to 500 cc of fluids per day - no changes in sodium level during this hospital stay - pt needs to have BMP check on Monday and this was discussed with her   Dehydration - unclear etiology - pt was allowed to drink and eat and fluid restriction was followed - creatinine has remained stable  during the hospital stay   Nausea & vomiting - possible related to UTI - resolved with antibiotics and supportive care, analgesia and antiemetics as needed   UTI (lower urinary tract infection) - has received total of 3 doses of Rocephin - therapy completed - symptoms resolved    Procedures/Studies:  None  Consultations:  Called endocrinologist at [facility] [place]  Antibiotics:  Rocephin for 3 doses  Discharge Exam: Filed Vitals:   05/09/12 0551  BP: 136/80  Pulse: 70  Temp: 97.6 F (36.4 C)  Resp: 18   Filed Vitals:   05/08/12 0546 05/08/12 1454 05/08/12 2136 05/09/12 0551  BP: 129/68 142/81 138/78 136/80  Pulse: 70 71 66 70  Temp: 98.1 F (36.7 C) 98.1 F (36.7 C) 97.4 F (36.3 C) 97.6 F (36.4 C)  TempSrc: Oral Oral Oral Oral  Resp: 16 18 20 18   Height:      Weight:      SpO2: 96% 98% 97% 98%    General: Pt is alert, follows commands appropriately, not in acute distress Cardiovascular: Regular rate and rhythm, S1/S2 +, no murmurs, no rubs, no gallops Respiratory: Clear to auscultation bilaterally, no wheezing, no crackles, no rhonchi Abdominal: Soft, non tender, non distended, bowel sounds +, no guarding Extremities: no edema, no cyanosis, pulses palpable bilaterally DP and PT Neuro: Grossly nonfocal  Discharge Instructions  Discharge Orders    Future Appointments: Provider: Department: Dept Phone: Center:   06/02/2012 10:15 AM Waymon Budge, MD [facility] [phone] None     Future Orders Please Complete By Expires  Diet - low sodium heart healthy      Increase activity slowly        Medication List  As of 05/09/2012  9:41 AM   TAKE these medications         LORazepam 1 MG tablet   Commonly known as: ATIVAN   Take 1 tablet (1 mg total) by mouth 2 (two) times daily as needed for anxiety. For anxiety      ondansetron 4 MG tablet   Commonly known as: ZOFRAN   Take 1 tablet (4 mg total) by mouth every 6 (six) hours as needed for  nausea.      sodium chloride 0.65 % Soln nasal spray   Commonly known as: OCEAN   Place 1 spray into the nose as needed for congestion.      tizanidine 2 MG capsule   Commonly known as: ZANAFLEX   Take 2 mg by mouth at bedtime.      zolpidem 10 MG tablet   Commonly known as: AMBIEN   Take 10 mg by mouth at bedtime.           Follow-up Information    Please follow up. (you must have sodium level checked on Monday 05/11/2012 and call your endocrinologist to let him know about the result )           The results of significant diagnostics from this hospitalization (including imaging, microbiology, ancillary and laboratory) are listed below for reference.     Microbiology: Recent Results (from the past 240 hour(s))  URINE CULTURE     Status: Normal   Collection Time   05/07/12  8:09 PM      Component Value Range Status Comment   Specimen Description URINE, CLEAN CATCH   Final    Special Requests NONE   Final    Culture  Setup Time 05/07/2012 21:00   Final    Colony Count NO GROWTH   Final    Culture NO GROWTH   Final    Report Status 05/08/2012 FINAL   Final      Labs: Basic Metabolic Panel:  Lab 05/09/12 1610 05/08/12 1340 05/07/12 2258 05/07/12 1609  NA 123* 124* -- 125*  K 4.0 3.9 -- 3.7  CL 88* 88* -- 86*  CO2 25 27 -- 28  GLUCOSE 93 94 -- 107*  BUN 12 11 -- 14  CREATININE 0.65 0.59 -- 0.59  CALCIUM 9.1 9.0 -- 9.3  MG -- -- 2.1 --  PHOS -- -- 3.5 --   CBC:  Lab 05/09/12 0632 05/07/12 1609  WBC 9.3 8.7  NEUTROABS -- 5.2  HGB 13.1 13.4  HCT 37.8 38.3  MCV 90.6 90.5  PLT 334 338   BNP: BNP (last 3 results)  Basename 05/07/12 2258  PROBNP 86.1    SIGNED: Time coordinating discharge: Over 30 minutes  Debbora Presto, MD  [facility] 05/09/2012, 9:41 AM Pager [phone]  If 7PM-7AM, please contact night-coverage [email or web address] Password [email or web address]

## 2012-06-02 ENCOUNTER — Ambulatory Visit (INDEPENDENT_AMBULATORY_CARE_PROVIDER_SITE_OTHER): Payer: Managed Care, Other (non HMO) | Admitting: Internal Medicine

## 2012-06-02 ENCOUNTER — Encounter: Payer: Self-pay | Admitting: Internal Medicine

## 2012-06-02 VITALS — BP 128/70 | HR 80 | Ht 62.0 in | Wt 163.8 lb

## 2012-06-02 DIAGNOSIS — D352 Benign neoplasm of pituitary gland: Secondary | ICD-10-CM

## 2012-06-02 DIAGNOSIS — G4733 Obstructive sleep apnea (adult) (pediatric): Secondary | ICD-10-CM

## 2012-06-02 NOTE — Patient Instructions (Addendum)
I would suggest some sort of check on your sleep apnea status now before assuming the sleep apnea is really all gone. The simplest would be to check an overnight oximetry. We can do an actual home sleep study if your insurance will agree. At a minimum, your husband should pay attention to your breathing while you sleep, and report if loud snoring and interupted breathing develop again.

## 2012-06-02 NOTE — Progress Notes (Signed)
04/01/12- 5 yoF never smoker referred by Dr Altheimer for preoperative evaluation. Husband here.  She has a pituitary adenoma with acromegaly. She has been referred for neurosurgery at [facility].  She was diagnosed with obstructive sleep apnea at [facility] based on NPSG 1021 and 11: AHI 41 per hour. She was severe in all positions, worse supine. Body weight was 146 pounds. CPAP titration 08/23/2010: 10 CWP with good control. She has been using CPAP with excellent control and compliance and firm by her husband who says she has not snored through the mask. She has been using CPAP 10/ nasaal pillows/ [employer]. Typical bedtime 9 PM with short sleep latency, waking 2 or 3 times during the night before finally up between 6:30 and 7:30 AM. Pain in the neck and shoulders has been waking her, resulting in increased daytime sleepiness. Especially over the past year, she has gained 20 pounds and has noted thickening in her tongue, coarsening of features and enlargement of the hands and feet. No history of ENT surgery. She had outpatient surgeries in the last 2 years which did not require her to stay off of CPAP. She denies history of cardiopulmonary disease. Seasonal allergic rhinitis, and in the spring. Nasal congestion, thickening of the tongue and weight gain had not so far required CPAP pressure change.  06/02/12- 43 yoF never smoker followed for  Hx OSA  complicated by a pituitary adenoma/acromegaly.  PCP Dr Altheimer Was seen last visit preop for NSGY pituitary adenoma at [facility]. She reports doing extremely well. Surgery went well although tumor had invaded her sinus cavity. Surgical approach behind the upper lip. Neck and shoulder pains are gone. She sleeps very well without CPAP now and husband tells her she is not snoring or stopping breathing. CPAP 10/ [employer] is not being used.   ROS-see HPI Constitutional:   No-   weight loss, night sweats, fevers, chills, fatigue, lassitude. HEENT:   + headaches,  No-difficulty swallowing, tooth/dental problems, sore throat,       No-  sneezing, itching, ear ache, nasal congestion, post nasal drip, ears feel stopped up CV:  No-   chest pain, orthopnea, PND, swelling in lower extremities, anasarca, dizziness, palpitations Resp: No-   shortness of breath with exertion or at rest.              No-   productive cough,  No non-productive cough,  No- coughing up of blood.              No-   change in color of mucus.  No- wheezing.   Skin: No-   rash or lesions. GI:  No-   heartburn, indigestion, abdominal pain, nausea, vomiting,  GU: . MS:  +  joint pain or swelling.  . Neuro-     nothing unusual Psych:  No- change in mood or affect. No- Situational depression or anxiety.  No memory loss.  OBJ- Physical Exam BP 128/70  Pulse 80  Ht 5\' 2"  (1.575 m)  Wt 163 lb 12.8 oz (74.299 kg)  BMI 29.96 kg/m2  SpO2 99% General- Alert, Oriented, Affect-appropriate, Distress- none acute, less-coarse features, still large hands and feet Skin- rash-none, lesions- none, excoriation- none Lymphadenopathy- none Head- atraumatic            Eyes- Gross vision intact, PERRLA, conjunctivae and secretions clear,            Ears- Hearing, canals-filled with cerumen bilaterally            Nose-  Clear, no-Septal dev, mucus, polyps, erosion, perforation             Throat- Mallampati III , mucosa clear , drainage- none, tonsils- atrophic Neck- flexible , trachea midline, no stridor , thyroid nl, carotid no bruit Chest - symmetrical excursion , unlabored           Heart/CV- RRR , no murmur , no gallop  , no rub, nl s1 s2                           - JVD- none , edema- none, stasis changes- none, varices- none           Lung- clear to P&A, wheeze- none, cough- none , dullness-none, rub- none           Chest wall-  Abd-  Br/ Gen/ Rectal- Not done, not indicated Extrem- cyanosis- none, clubbing, none, atrophy- none, strength- nl Neuro- grossly intact to observation

## 2012-06-10 ENCOUNTER — Encounter: Payer: Self-pay | Admitting: Internal Medicine

## 2012-06-10 NOTE — Assessment & Plan Note (Signed)
A little surprised that she and husband noticed so much improvement after her pituitary surgery. Marland Kitchen She was not allowed to restart CPAP for several weeks after her surgery to avoid pressurizing her cribriform plate. He Plan-I suggest overnight oximetry on room air

## 2013-07-28 DIAGNOSIS — D352 Benign neoplasm of pituitary gland: Secondary | ICD-10-CM | POA: Insufficient documentation

## 2014-03-19 ENCOUNTER — Emergency Department (HOSPITAL_COMMUNITY)
Admission: EM | Admit: 2014-03-19 | Discharge: 2014-03-19 | Disposition: A | Discharge status: Home / Self Care | Payer: Managed Care, Other (non HMO) | Attending: Emergency Medicine | Admitting: Emergency Medicine

## 2014-03-19 ENCOUNTER — Encounter (HOSPITAL_COMMUNITY): Payer: Self-pay | Admitting: Emergency Medicine

## 2014-03-19 DIAGNOSIS — T7840XA Allergy, unspecified, initial encounter: Secondary | ICD-10-CM

## 2014-03-19 DIAGNOSIS — Z8639 Personal history of other endocrine, nutritional and metabolic disease: Secondary | ICD-10-CM | POA: Insufficient documentation

## 2014-03-19 DIAGNOSIS — Z862 Personal history of diseases of the blood and blood-forming organs and certain disorders involving the immune mechanism: Secondary | ICD-10-CM | POA: Insufficient documentation

## 2014-03-19 DIAGNOSIS — Z8669 Personal history of other diseases of the nervous system and sense organs: Secondary | ICD-10-CM | POA: Insufficient documentation

## 2014-03-19 DIAGNOSIS — T4995XA Adverse effect of unspecified topical agent, initial encounter: Secondary | ICD-10-CM | POA: Insufficient documentation

## 2014-03-19 DIAGNOSIS — R6889 Other general symptoms and signs: Secondary | ICD-10-CM | POA: Insufficient documentation

## 2014-03-19 DIAGNOSIS — Z8739 Personal history of other diseases of the musculoskeletal system and connective tissue: Secondary | ICD-10-CM | POA: Insufficient documentation

## 2014-03-19 DIAGNOSIS — R21 Rash and other nonspecific skin eruption: Secondary | ICD-10-CM | POA: Insufficient documentation

## 2014-03-19 MED ORDER — PREDNISONE 10 MG PO TABS: 20.0000 mg | ORAL_TABLET | Freq: Every day | ORAL | Status: DC

## 2014-03-19 NOTE — Discharge Instructions (Signed)
Stop taking the vitamins as we discussed. Use Benadryl or Claritin as directed. Return here for any problems Allergies Allergies may happen from anything your body is sensitive to. This may be food, medicines, pollens, chemicals, and nearly anything around you in everyday life that produces allergens. An allergen is anything that causes an allergy producing substance. Heredity is often a factor in causing these problems. This means you may have some of the same allergies as your parents. Food allergies happen in all age groups. Food allergies are some of the most severe and life threatening. Some common food allergies are cow's milk, seafood, eggs, nuts, wheat, and soybeans. SYMPTOMS   Swelling around the mouth.  An itchy red rash or hives.  Vomiting or diarrhea.  Difficulty breathing. SEVERE ALLERGIC REACTIONS ARE LIFE-THREATENING. This reaction is called anaphylaxis. It can cause the mouth and throat to swell and cause difficulty with breathing and swallowing. In severe reactions only a trace amount of food (for example, peanut oil in a salad) may cause death within seconds. Seasonal allergies occur in all age groups. These are seasonal because they usually occur during the same season every year. They may be a reaction to molds, grass pollens, or tree pollens. Other causes of problems are house dust mite allergens, pet dander, and mold spores. The symptoms often consist of nasal congestion, a runny itchy nose associated with sneezing, and tearing itchy eyes. There is often an associated itching of the mouth and ears. The problems happen when you come in contact with pollens and other allergens. Allergens are the particles in the air that the body reacts to with an allergic reaction. This causes you to release allergic antibodies. Through a chain of events, these eventually cause you to release histamine into the blood stream. Although it is meant to be protective to the body, it is this release  that causes your discomfort. This is why you were given anti-histamines to feel better. If you are unable to pinpoint the offending allergen, it may be determined by skin or blood testing. Allergies cannot be cured but can be controlled with medicine. Hay fever is a collection of all or some of the seasonal allergy problems. It may often be treated with simple over-the-counter medicine such as diphenhydramine. Take medicine as directed. Do not drink alcohol or drive while taking this medicine. Check with your caregiver or package insert for child dosages. If these medicines are not effective, there are many new medicines your caregiver can prescribe. Stronger medicine such as nasal spray, eye drops, and corticosteroids may be used if the first things you try do not work well. Other treatments such as immunotherapy or desensitizing injections can be used if all else fails. Follow up with your caregiver if problems continue. These seasonal allergies are usually not life threatening. They are generally more of a nuisance that can often be handled using medicine. HOME CARE INSTRUCTIONS   If unsure what causes a reaction, keep a diary of foods eaten and symptoms that follow. Avoid foods that cause reactions.  If hives or rash are present:  Take medicine as directed.  You may use an over-the-counter antihistamine (diphenhydramine) for hives and itching as needed.  Apply cold compresses (cloths) to the skin or take baths in cool water. Avoid hot baths or showers. Heat will make a rash and itching worse.  If you are severely allergic:  Following a treatment for a severe reaction, hospitalization is often required for closer follow-up.  Wear a medic-alert bracelet  or necklace stating the allergy.  You and your family must learn how to give adrenaline or use an anaphylaxis kit.  If you have had a severe reaction, always carry your anaphylaxis kit or EpiPen with you. Use this medicine as directed by  your caregiver if a severe reaction is occurring. Failure to do so could have a fatal outcome. SEEK MEDICAL CARE IF:  You suspect a food allergy. Symptoms generally happen within 30 minutes of eating a food.  Your symptoms have not gone away within 2 days or are getting worse.  You develop new symptoms.  You want to retest yourself or your child with a food or drink you think causes an allergic reaction. Never do this if an anaphylactic reaction to that food or drink has happened before. Only do this under the care of a caregiver. SEEK IMMEDIATE MEDICAL CARE IF:   You have difficulty breathing, are wheezing, or have a tight feeling in your chest or throat.  You have a swollen mouth, or you have hives, swelling, or itching all over your body.  You have had a severe reaction that has responded to your anaphylaxis kit or an EpiPen. These reactions may return when the medicine has worn off. These reactions should be considered life threatening. MAKE SURE YOU:   Understand these instructions.  Will watch your condition.  Will get help right away if you are not doing well or get worse. Document Released: 12/17/2002 Document Revised: 01/18/2013 Document Reviewed: 05/23/2008 [facility] Patient Information 2014 [place].

## 2014-03-19 NOTE — ED Provider Notes (Signed)
CSN: [id number]     Arrival date & time 03/19/14  0932 History   First MD Initiated Contact with Patient 03/19/14 [phone]     Chief Complaint  Patient presents with  . Allergic Reaction     (Consider location/radiation/quality/duration/timing/severity/associated sxs/prior Treatment) Patient is a 53 y.o. female presenting with allergic reaction. The history is provided by the patient.  Allergic Reaction  patient here after having an allergic reaction to an unknown substance just prior to arrival. She describes her hive-like pruritic rash started on her chest and spread to her body. She has some tightness in her throat but did not have any stridor or wheezing. Called EMS and was given Benadryl and Zantac orally. She now feels much better. States that she did start a new diet 5 days ago and has been using a liquid vitamin. No other new medications or chemicals have been introduced. She feels almost back to her baseline.  Past Medical History  Diagnosis Date  . Degenerative disc disease   . OSA (obstructive sleep apnea)   . 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 Degenerative disk disease    Past Surgical History  Procedure Laterality Date  . Elbow ligament surgery right remote    . Bladder suspension, unspecified    . Hemorrhoid surgery  06/2007  . Appendectomy  04/2008  . Burnionectomy, bilateral    . Rotator cuff repair right shoulder  11/2010  . Nerve decompression right c5 and nerve ablation c5 and c6    . Pituitary surgery  2013    [facility]/ Pituitary adenoma w/ acromegaly   Family History  Problem Relation Age of Onset  . Heart disease Father   . Coronary artery disease Father   . Heart disease Mother   . Coronary artery disease Mother   . Sleep apnea Sister    History  Substance Use Topics  . Smoking status: Never Smoker   . Smokeless tobacco: Not on file  . Alcohol Use: Yes     Comment: socially   OB History   Grav Para Term Preterm Abortions TAB SAB Ect Mult Living             Review of Systems  All other systems reviewed and are negative.     Allergies  Epinephrine and Codeine  Home Medications   Prior to Admission medications   Medication Sig Start Date End Date Taking? Authorizing Provider  ibuprofen (ADVIL,MOTRIN) 200 MG tablet Take 600 mg by mouth every 6 (six) hours as needed for mild pain.   Yes Historical Provider, MD  Multiple Vitamin (MULTIVITAMIN WITH MINERALS) TABS tablet Take 1 tablet by mouth daily.   Yes Historical Provider, MD  zolpidem (AMBIEN) 10 MG tablet Take 5 mg by mouth at bedtime as needed for sleep.    Yes Historical Provider, MD   BP 136/78  Pulse 84  Temp(Src) 97.8 F (36.6 C) (Oral)  Resp 20  SpO2 100% Physical Exam  Nursing note and vitals reviewed. Constitutional: She is oriented to person, place, and time. She appears well-developed and well-nourished.  Non-toxic appearance. No distress.  HENT:  Head: Normocephalic and atraumatic.  Eyes: Conjunctivae, EOM and lids are normal. Pupils are equal, round, and reactive to light.  Neck: Normal range of motion. Neck supple. No tracheal deviation present. No mass present.  Cardiovascular: Normal rate, regular rhythm and normal heart sounds.  Exam reveals no gallop.   No murmur heard. Pulmonary/Chest: Effort normal and breath sounds normal. No stridor.  No respiratory distress. She has no decreased breath sounds. She has no wheezes. She has no rhonchi. She has no rales.  Abdominal: Soft. Normal appearance and bowel sounds are normal. She exhibits no distension. There is no tenderness. There is no rebound and no CVA tenderness.  Musculoskeletal: Normal range of motion. She exhibits no edema and no tenderness.  Neurological: She is alert and oriented to person, place, and time. She has normal strength. No cranial nerve deficit or sensory deficit. GCS eye subscore is 4. GCS verbal subscore is 5. GCS motor subscore is 6.  Skin: Skin is warm and dry. No abrasion and no rash  noted.  Resolving hive-like rash  Psychiatric: She has a normal mood and affect. Her speech is normal and behavior is normal.    ED Course  Procedures (including critical care time) Labs Review Labs Reviewed - No data to display  Imaging Review No results found.   EKG Interpretation None      MDM   Final diagnoses:  None    Patient has a history of pituitary tumor and has been told to avoid all corticosteroids. Patient instructed to use Benadryl or Claritin and to avoid the liquid vitamins and return here for any problems    Leota Jacobsen, MD 03/19/14 [phone]

## 2014-03-19 NOTE — ED Notes (Signed)
Bed: JP21 Expected date:  Expected time:  Means of arrival:  Comments: EMS allergic rxn

## 2014-03-19 NOTE — ED Notes (Signed)
Per EMS pt coming from home with c/o allergic reaction. Per EMS pt was getting ready to leave her house and suddenly felt very hot and developed hives throughout entire body. EMS sts pt reported initially she was having difficulty breathing which resolved prior to their arrival. Pt denies any change in soap, detergent,lotions and reports only new medication is multivitamin that she started taking few days ago. Pt was given 50 mg benadryl PO and 50 mg zantac en route. VSS, pt in NAD on arrival.

## 2015-04-11 ENCOUNTER — Ambulatory Visit (INDEPENDENT_AMBULATORY_CARE_PROVIDER_SITE_OTHER): Payer: Managed Care, Other (non HMO) | Admitting: Internal Medicine

## 2015-04-11 ENCOUNTER — Telehealth: Payer: Self-pay | Admitting: Internal Medicine

## 2015-04-11 ENCOUNTER — Encounter: Payer: Self-pay | Admitting: Internal Medicine

## 2015-04-11 VITALS — BP 122/82 | HR 84 | Ht 62.0 in | Wt 167.8 lb

## 2015-04-11 DIAGNOSIS — G4733 Obstructive sleep apnea (adult) (pediatric): Secondary | ICD-10-CM | POA: Diagnosis not present

## 2015-04-11 DIAGNOSIS — D352 Benign neoplasm of pituitary gland: Secondary | ICD-10-CM

## 2015-04-11 NOTE — Patient Instructions (Addendum)
Order - schedule Unattended home sleep study   Dx OSA  Ok to go forward with your planned surgery. I don't think you will need CPAP, but anesthesia can add it in recovery on autotitration if they feel appropriate.  Ask your husband to pay extra attention to your breathing at night while you are taking pain meds, as discussed.

## 2015-04-11 NOTE — Assessment & Plan Note (Signed)
She admits no symptoms now suggestive of obstructive sleep apnea, including reported observations of her husband. It would be interesting if resection of her pituitary adenoma allowed reversal of features contributing to upper airway obstruction. Plan: A little extra observation during sedated recovery will be appropriate, but at this time I would be comfortable letting her go forward with planned surgery without CPAP. We're scheduling a follow-up sleep study but there probably won't be time to get that done before surgery.

## 2015-04-11 NOTE — Assessment & Plan Note (Signed)
She denies long-term postoperative problems

## 2015-04-11 NOTE — Telephone Encounter (Signed)
Error

## 2015-04-11 NOTE — Progress Notes (Signed)
04/01/12- 76 yoF never smoker referred by Dr Altheimer for preoperative evaluation. Husband here.  She has a pituitary adenoma with acromegaly. She has been referred for neurosurgery at [facility].  She was diagnosed with obstructive sleep apnea at [facility] based on NPSG 1021 and 11: AHI 41 per hour. She was severe in all positions, worse supine. Body weight was 146 pounds. CPAP titration 08/23/2010: 10 CWP with good control. She has been using CPAP with excellent control and compliance and firm by her husband who says she has not snored through the mask. She has been using CPAP 10/ nasaal pillows/ [employer]. Typical bedtime 9 PM with short sleep latency, waking 2 or 3 times during the night before finally up between 6:30 and 7:30 AM. Pain in the neck and shoulders has been waking her, resulting in increased daytime sleepiness. Especially over the past year, she has gained 20 pounds and has noted thickening in her tongue, coarsening of features and enlargement of the hands and feet. No history of ENT surgery. She had outpatient surgeries in the last 2 years which did not require her to stay off of CPAP. She denies history of cardiopulmonary disease. Seasonal allergic rhinitis, and in the spring. Nasal congestion, thickening of the tongue and weight gain had not so far required CPAP pressure change.  06/02/12- 58 yoF never smoker followed for  Hx OSA/failed CPAP complicated by a pituitary adenoma/acromegaly.  PCP Dr Altheimer Was seen last visit preop for NSGY pituitary adenoma at [facility]. She reports doing extremely well. Surgery went well although tumor had invaded her sinus cavity. Surgical approach behind the upper lip. Neck and shoulder pains are gone. She sleeps very well without CPAP now and husband tells her she is not snoring or stopping breathing. CPAP 10/ [employer] is not being used.  04/11/15- 51 yoF never smoker followed for  Hx OSA/failed CPAP complicated by a pituitary adenoma/acromegaly.  PCP Dr  Altheimer FOLLOW FOR: hx of Sleep apnea, had brain tumor removed and has not slept with CPAP since.  Needs surgical clearance to have gyn mesh after erosion. Plan outpt GOT at [facility].  Denies EDS or snoring per husband. She feels stable but would like follow-up sleep study at some point to clarify her status.  ROS-see HPI Constitutional:   No-   weight loss, night sweats, fevers, chills, fatigue, lassitude. HEENT:   + headaches, No-difficulty swallowing, tooth/dental problems, sore throat,       No-  sneezing, itching, ear ache, nasal congestion, post nasal drip, ears feel stopped up CV:  No-   chest pain, orthopnea, PND, swelling in lower extremities, anasarca, dizziness, palpitations Resp: No-   shortness of breath with exertion or at rest.              No-   productive cough,  No non-productive cough,  No- coughing up of blood.             No-   change in color of mucus.  No- wheezing.   Skin: No-   rash or lesions. GI:  No-   heartburn, indigestion, abdominal pain, nausea, vomiting,  GU: . MS:  +  joint pain or swelling.  . Neuro-     nothing unusual Psych:  No- change in mood or affect. No- Situational depression or anxiety.  No memory loss.  OBJ- Physical Exam General- Alert, Oriented, Affect-appropriate, Distress- none acute, medium build,  less-coarse features, Skin- rash-none, lesions- none, excoriation- none Lymphadenopathy- none Head- atraumatic  Eyes- Gross vision intact, PERRLA, conjunctivae and secretions clear,            Ears- Hearing, canals-filled with cerumen bilaterally            Nose- Clear, no-Septal dev, mucus, polyps, erosion, perforation             Throat- Mallampati III , mucosa clear , drainage- none, tonsils- atrophic Neck- flexible , trachea midline, no stridor , thyroid nl, carotid no bruit Chest - symmetrical excursion , unlabored           Heart/CV- RRR , no murmur , no gallop  , no rub, nl s1 s2                           - JVD- none , edema-  none, stasis changes- none, varices- none           Lung- clear to P&A, wheeze- none, cough- none , dullness-none, rub- none           Chest wall-  Abd-  Br/ Gen/ Rectal- Not done, not indicated Extrem- cyanosis- none, clubbing, none, atrophy- none, strength- nl Neuro- grossly intact to observation

## 2015-04-25 DIAGNOSIS — G4730 Sleep apnea, unspecified: Secondary | ICD-10-CM | POA: Diagnosis not present

## 2015-04-28 ENCOUNTER — Telehealth: Payer: Self-pay | Admitting: Internal Medicine

## 2015-04-28 DIAGNOSIS — G4730 Sleep apnea, unspecified: Secondary | ICD-10-CM | POA: Diagnosis not present

## 2015-04-28 NOTE — Telephone Encounter (Signed)
Patient would like results of Home sleep study.  Patient is having surgery next week and needs a copy to take with her to [place] for her surgery. Did not see results in chart, Dr. Annamaria Boots do you have results?  Please advise.

## 2015-04-28 NOTE — Telephone Encounter (Signed)
Called pt and is aware of results. Copy of sleep study placed for pick up. Nothing further needed

## 2015-04-28 NOTE — Telephone Encounter (Signed)
[name] has the study. It did show that patient still has moderate obstructive sleep apnea. I would advise her to continue CPAP at 10.

## 2015-05-01 ENCOUNTER — Encounter: Payer: Self-pay | Admitting: Internal Medicine

## 2015-05-01 ENCOUNTER — Other Ambulatory Visit: Payer: Self-pay | Admitting: *Deleted

## 2015-05-01 DIAGNOSIS — G4733 Obstructive sleep apnea (adult) (pediatric): Secondary | ICD-10-CM

## 2015-08-14 ENCOUNTER — Ambulatory Visit: Payer: Managed Care, Other (non HMO) | Admitting: Internal Medicine

## 2016-07-24 ENCOUNTER — Ambulatory Visit
Admission: RE | Admit: 2016-07-24 | Discharge: 2016-07-24 | Disposition: A | Discharge status: Home / Self Care | Payer: Managed Care, Other (non HMO) | Source: Ambulatory Visit | Attending: Family Medicine | Admitting: Family Medicine

## 2016-07-24 ENCOUNTER — Other Ambulatory Visit: Payer: Self-pay | Admitting: Family Medicine

## 2016-07-24 DIAGNOSIS — Z01818 Encounter for other preprocedural examination: Secondary | ICD-10-CM

## 2016-09-25 NOTE — Progress Notes (Signed)
Pt is being scheduled for preop appt; please place surgical orders in epic. Thanks.  

## 2016-10-14 NOTE — Progress Notes (Signed)
HAS PRE OP 10/17/16--NEED SURGICAL ORDERS IN EPIC PLEASE AND THANK YOU

## 2016-10-15 ENCOUNTER — Ambulatory Visit: Payer: Self-pay | Admitting: Orthopedic Surgery

## 2016-10-15 NOTE — Progress Notes (Signed)
Has pre op THURS-- PLEASE PLACE SURGICAL ORDERS IN EPIC  THANKS

## 2016-10-16 NOTE — Patient Instructions (Signed)
Saiya Sultani Bilotta  10/16/2016   Your procedure is scheduled on: 10/23/16    Report to [facility] Main  Entrance take [place]  elevators to 3rd floor to  [place] at   [place] AM.  Call this number if you have problems the morning of surgery [phone]   Remember: ONLY 1 PERSON MAY GO WITH YOU TO SHORT STAY TO GET  READY MORNING OF YOUR SURGERY.  Do not eat food or drink liquids :After Midnight.     Take these medicines the morning of surgery with A SIP OF WATER: none                                 You may not have any metal on your body including hair pins and              piercings  Do not wear jewelry, make-up, lotions, powders or perfumes, deodorant             Do not wear nail polish.  Do not shave  48 hours prior to surgery.                 Do not bring valuables to the hospital. [facility]. [place].  Contacts, dentures or bridgework may not be worn into surgery.  Leave suitcase in the car. After surgery it may be brought to your room.       Special Instructions: N/A              Please read over the following fact sheets you were given: _____________________________________________________________________             [facility] - Preparing for Surgery Before surgery, you can play an important role.  Because skin is not sterile, your skin needs to be as free of germs as possible.  You can reduce the number of germs on your skin by washing with CHG (chlorahexidine gluconate) soap before surgery.  CHG is an antiseptic cleaner which kills germs and bonds with the skin to continue killing germs even after washing. Please DO NOT use if you have an allergy to CHG or antibacterial soaps.  If your skin becomes reddened/irritated stop using the CHG and inform your nurse when you arrive at Short Stay. Do not shave (including legs and underarms) for at least 48 hours prior to the first CHG shower.  You may  shave your face/neck. Please follow these instructions carefully:  1.  Shower with CHG Soap the night before surgery and the  morning of Surgery.  2.  If you choose to wash your hair, wash your hair first as usual with your  normal  shampoo.  3.  After you shampoo, rinse your hair and body thoroughly to remove the  shampoo.                           4.  Use CHG as you would any other liquid soap.  You can apply chg directly  to the skin and wash                       Gently with a scrungie or clean washcloth.  5.  Apply the CHG Soap to your body ONLY FROM THE NECK DOWN.   Do not use on face/ open                           Wound or open sores. Avoid contact with eyes, ears mouth and genitals (private parts).                       Wash face,  Genitals (private parts) with your normal soap.             6.  Wash thoroughly, paying special attention to the area where your surgery  will be performed.  7.  Thoroughly rinse your body with warm water from the neck down.  8.  DO NOT shower/wash with your normal soap after using and rinsing off  the CHG Soap.                9.  Pat yourself dry with a clean towel.            10.  Wear clean pajamas.            11.  Place clean sheets on your bed the night of your first shower and do not  sleep with pets. Day of Surgery : Do not apply any lotions/deodorants the morning of surgery.  Please wear clean clothes to the hospital/surgery center.  FAILURE TO FOLLOW THESE INSTRUCTIONS MAY RESULT IN THE CANCELLATION OF YOUR SURGERY PATIENT SIGNATURE_________________________________  NURSE SIGNATURE__________________________________  ________________________________________________________________________  WHAT IS A BLOOD TRANSFUSION? Blood Transfusion Information  A transfusion is the replacement of blood or some of its parts. Blood is made up of multiple cells which provide different functions.  Red blood cells carry oxygen and are used for blood loss  replacement.  White blood cells fight against infection.  Platelets control bleeding.  Plasma helps clot blood.  Other blood products are available for specialized needs, such as hemophilia or other clotting disorders. BEFORE THE TRANSFUSION  Who gives blood for transfusions?   Healthy volunteers who are fully evaluated to make sure their blood is safe. This is blood bank blood. Transfusion therapy is the safest it has ever been in the practice of medicine. Before blood is taken from a donor, a complete history is taken to make sure that person has no history of diseases nor engages in risky social behavior (examples are intravenous drug use or sexual activity with multiple partners). The donor's travel history is screened to minimize risk of transmitting infections, such as malaria. The donated blood is tested for signs of infectious diseases, such as HIV and hepatitis. The blood is then tested to be sure it is compatible with you in order to minimize the chance of a transfusion reaction. If you or a relative donates blood, this is often done in anticipation of surgery and is not appropriate for emergency situations. It takes many days to process the donated blood. RISKS AND COMPLICATIONS Although transfusion therapy is very safe and saves many lives, the main dangers of transfusion include:   Getting an infectious disease.  Developing a transfusion reaction. This is an allergic reaction to something in the blood you were given. Every precaution is taken to prevent this. The decision to have a blood transfusion has been considered carefully by your caregiver before blood is given. Blood is not given unless the benefits outweigh the risks. AFTER THE TRANSFUSION  Right after receiving a  blood transfusion, you will usually feel much better and more energetic. This is especially true if your red blood cells have gotten low (anemic). The transfusion raises the level of the red blood cells which  carry oxygen, and this usually causes an energy increase.  The nurse administering the transfusion will monitor you carefully for complications. HOME CARE INSTRUCTIONS  No special instructions are needed after a transfusion. You may find your energy is better. Speak with your caregiver about any limitations on activity for underlying diseases you may have. SEEK MEDICAL CARE IF:   Your condition is not improving after your transfusion.  You develop redness or irritation at the intravenous (IV) site. SEEK IMMEDIATE MEDICAL CARE IF:  Any of the following symptoms occur over the next 12 hours:  Shaking chills.  You have a temperature by mouth above 102 F (38.9 C), not controlled by medicine.  Chest, back, or muscle pain.  People around you feel you are not acting correctly or are confused.  Shortness of breath or difficulty breathing.  Dizziness and fainting.  You get a rash or develop hives.  You have a decrease in urine output.  Your urine turns a dark color or changes to pink, red, or brown. Any of the following symptoms occur over the next 10 days:  You have a temperature by mouth above 102 F (38.9 C), not controlled by medicine.  Shortness of breath.  Weakness after normal activity.  The white part of the eye turns yellow (jaundice).  You have a decrease in the amount of urine or are urinating less often.  Your urine turns a dark color or changes to pink, red, or brown. Document Released: 09/20/2000 Document Revised: 12/16/2011 Document Reviewed: 05/09/2008 Exit[employer] Patient Information 2014 [place].  _______________________________________________________________________  Incentive Spirometer  An incentive spirometer is a tool that can help keep your lungs clear and active. This tool measures how well you are filling your lungs with each breath. Taking long deep breaths may help reverse or decrease the chance of developing breathing (pulmonary) problems  (especially infection) following:  A long period of time when you are unable to move or be active. BEFORE THE PROCEDURE   If the spirometer includes an indicator to show your best effort, your nurse or respiratory therapist will set it to a desired goal.  If possible, sit up straight or lean slightly forward. Try not to slouch.  Hold the incentive spirometer in an upright position. INSTRUCTIONS FOR USE  1. Sit on the edge of your bed if possible, or sit up as far as you can in bed or on a chair. 2. Hold the incentive spirometer in an upright position. 3. Breathe out normally. 4. Place the mouthpiece in your mouth and seal your lips tightly around it. 5. Breathe in slowly and as deeply as possible, raising the piston or the ball toward the top of the column. 6. Hold your breath for 3-5 seconds or for as long as possible. Allow the piston or ball to fall to the bottom of the column. 7. Remove the mouthpiece from your mouth and breathe out normally. 8. Rest for a few seconds and repeat Steps 1 through 7 at least 10 times every 1-2 hours when you are awake. Take your time and take a few normal breaths between deep breaths. 9. The spirometer may include an indicator to show your best effort. Use the indicator as a goal to work toward during each repetition. 10. After each set of 10  deep breaths, practice coughing to be sure your lungs are clear. If you have an incision (the cut made at the time of surgery), support your incision when coughing by placing a pillow or rolled up towels firmly against it. Once you are able to get out of bed, walk around indoors and cough well. You may stop using the incentive spirometer when instructed by your caregiver.  RISKS AND COMPLICATIONS  Take your time so you do not get dizzy or light-headed.  If you are in pain, you may need to take or ask for pain medication before doing incentive spirometry. It is harder to take a deep breath if you are having  pain. AFTER USE  Rest and breathe slowly and easily.  It can be helpful to keep track of a log of your progress. Your caregiver can provide you with a simple table to help with this. If you are using the spirometer at home, follow these instructions: [place] IF:   You are having difficultly using the spirometer.  You have trouble using the spirometer as often as instructed.  Your pain medication is not giving enough relief while using the spirometer.  You develop fever of 100.5 F (38.1 C) or higher. SEEK IMMEDIATE MEDICAL CARE IF:   You cough up bloody sputum that had not been present before.  You develop fever of 102 F (38.9 C) or greater.  You develop worsening pain at or near the incision site. MAKE SURE YOU:   Understand these instructions.  Will watch your condition.  Will get help right away if you are not doing well or get worse. Document Released: 02/03/2007 Document Revised: 12/16/2011 Document Reviewed: 04/06/2007 [facility] Patient Information 2014 [place], [place].   ________________________________________________________________________

## 2016-10-17 ENCOUNTER — Encounter (HOSPITAL_COMMUNITY)
Admission: RE | Admit: 2016-10-17 | Discharge: 2016-10-17 | Disposition: A | Discharge status: Home / Self Care | Payer: Commercial Managed Care - PPO | Source: Ambulatory Visit | Attending: Orthopedic Surgery | Admitting: Orthopedic Surgery

## 2016-10-17 ENCOUNTER — Encounter (HOSPITAL_COMMUNITY): Payer: Self-pay

## 2016-10-17 DIAGNOSIS — Z01812 Encounter for preprocedural laboratory examination: Secondary | ICD-10-CM | POA: Insufficient documentation

## 2016-10-17 HISTORY — DX: Other specified postprocedural states: Z98.890

## 2016-10-17 HISTORY — DX: Other complications of anesthesia, initial encounter: T88.59XA

## 2016-10-17 HISTORY — DX: Nausea with vomiting, unspecified: R11.2

## 2016-10-17 HISTORY — DX: Adverse effect of unspecified anesthetic, initial encounter: T41.45XA

## 2016-10-17 LAB — CBC
HCT: 42.8 % (ref 36.0–46.0)
Hemoglobin: 14.3 g/dL (ref 12.0–15.0)
MCH: 30.3 pg (ref 26.0–34.0)
MCHC: 33.4 g/dL (ref 30.0–36.0)
MCV: 90.7 fL (ref 78.0–100.0)
Platelets: 276 10*3/uL (ref 150–400)
RBC: 4.72 MIL/uL (ref 3.87–5.11)
RDW: 13.2 % (ref 11.5–15.5)
WBC: 7 10*3/uL (ref 4.0–10.5)

## 2016-10-17 LAB — PROTIME-INR
INR: 0.93
Prothrombin Time: 12.5 seconds (ref 11.4–15.2)

## 2016-10-17 LAB — COMPREHENSIVE METABOLIC PANEL
ALT: 17 U/L (ref 14–54)
AST: 20 U/L (ref 15–41)
Albumin: 4.4 g/dL (ref 3.5–5.0)
Alkaline Phosphatase: 63 U/L (ref 38–126)
Anion gap: 7 (ref 5–15)
BUN: 21 mg/dL — ABNORMAL HIGH (ref 6–20)
CO2: 30 mmol/L (ref 22–32)
Calcium: 9.5 mg/dL (ref 8.9–10.3)
Chloride: 104 mmol/L (ref 101–111)
Creatinine, Ser: 0.87 mg/dL (ref 0.44–1.00)
GFR calc Af Amer: >60 mL/min (ref >60)
GFR calc non Af Amer: >60 mL/min (ref >60)
Glucose, Bld: 84 mg/dL (ref 65–99)
Potassium: 4.3 mmol/L (ref 3.5–5.1)
Sodium: 141 mmol/L (ref 135–145)
Total Bilirubin: 0.5 mg/dL (ref 0.3–1.2)
Total Protein: 7.7 g/dL (ref 6.5–8.1)

## 2016-10-17 LAB — SURGICAL PCR SCREEN
MRSA, PCR: NEGATIVE
Staphylococcus aureus: NEGATIVE

## 2016-10-17 LAB — ABO/RH: ABO/RH(D): O POS

## 2016-10-17 LAB — APTT: aPTT: 29 seconds (ref 24–36)

## 2016-10-17 NOTE — Progress Notes (Signed)
Clearance 10/18/17Dr. Koirala on chart. EKG 07/24/16 on chart, LOV note 07/24/16 on chart.

## 2016-10-22 ENCOUNTER — Ambulatory Visit: Payer: Self-pay | Admitting: Orthopedic Surgery

## 2016-10-22 NOTE — H&P (Signed)
[name] DOB: Jul 22, 1961 Married / Language: English / Race: White Female Date of Admission:  10/23/2016 CC:  Right hip pain History of Present Illness  The patient is a 56 year old female who comes in for a preoperative History and Physical. The patient is scheduled for a right total hip arthroplasty (anterior) to be performed by Dr. Dione Plover. Aluisio, MD at [facility] on 10-23-2016. The patient is a 57 year old female who presented with a hip problem. The patient reports right hip problems including pain symptoms that have been present for 5 year(s). The symptoms began without any known injury. Symptoms reported include hip pain, pain with weightbearing, night pain, popping, grinding, numbness, tingling and catching The patient reports symptoms radiating to the: right groin, right thigh, right thigh anteriorly, right thigh posteriorly, right knee and right calf. The patient describes the hip problem as sharp.The patient feels as if their symptoms are does feel they are worsening. Symptoms are exacerbated by movement, weight bearing, sitting and lying on the affected side. Current treatment includes application of ice, application of heat and nonsteroidal anti-inflammatory drugs (Mobic prn). Previous workup for this problem has included pelvic x-rays and hip x-rays. The hip problems have been going on for quite a while now, getting progressively worse over time. Pain is in her groin, anterior thigh. It is limiting what she can and cannot do. She is at a stage now that hip has essentially taken over her life. She is not having any left hip pain. She is not having any lower back pain, lower extremity weakness or paresthesias with this. AP pelvis and lateral of the right hip show bone on bone arthritis of the right hip with subchondral cystic formation. Left hip has slight change. She does have dysplastic changes especially in the right acetabulum. She has got advanced arthritic change  in the right hip, most likely secondary to dysplasia. At this point, treatment options would include injection versus hip replacement. At this point, the most predictable means of improving pain and function is total hip arthroplasty. She is at a stage where she wants to proceed with surgery. They have been treated conservatively in the past for the above stated problem and despite conservative measures, they continue to have progressive pain and severe functional limitations and dysfunction. They have failed non-operative management including home exercise, medications. It is felt that they would benefit from undergoing total joint replacement. Risks and benefits of the procedure have been discussed with the patient and they elect to proceed with surgery. There are no active contraindications to surgery such as ongoing infection or rapidly progressive neurological disease.  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  Family History Cancer  grandmother mothers side, grandfather mothers side and grandmother fathers side Depression  sister Drug / Alcohol Addiction  brother Heart Disease  mother, father and grandfather mothers side Heart disease in female family member before age 29  Heart disease in female family member before age 18   Social History Alcohol use  current drinker; only occasionally per week Children  1 Current work status  unemployed Drug/Alcohol Rehab (Currently)  no Drug/Alcohol Rehab (Previously)  no Exercise  Exercises  rarely Illicit drug use  no Living situation  live with spouse Marital status  married Number of flights of stairs before winded  4-5 Pain Contract  no Tobacco use  never smoker  Medication History  Meloxicam (15MG  Tablet, Oral) Active. Premarin (0.625MG /GM Cream, Vaginal) Active. Glucosamine Chondr 500 Complex (Oral) Specific strength unknown - Active. Probiotic (Oral) Active. Fish Oil (1000MG  Capsule, Oral) Active. Vitamin D (Oral) Specific strength unknown - Active. Zinc (Oral) Specific strength unknown - Active.   Past Surgical History Appendectomy  Hemorrhoidectomy  Neck Disc Surgery  Rotator Cuff Repair  right    Review of Systems General Not Present- Chills, Fatigue, Fever, Memory Loss, Night Sweats, Weight Gain and Weight Loss. Skin Not Present- Eczema, Hives, Itching, Lesions and Rash. HEENT Not Present- Dentures, Double Vision, Headache, Hearing Loss, Tinnitus and Visual Loss. Respiratory Not Present- Allergies, Chronic Cough, Coughing up blood, Shortness of breath at rest and Shortness of breath with exertion. Cardiovascular Not Present- Chest Pain, Difficulty Breathing Lying Down, Murmur, Palpitations, Racing/skipping heartbeats and Swelling. Gastrointestinal Not Present- Abdominal Pain, Bloody Stool, Constipation, Diarrhea, Difficulty Swallowing, Heartburn, Jaundice, Loss of appetitie, Nausea and Vomiting. Female Genitourinary Not Present- Blood in Urine, Discharge, Flank Pain, Incontinence, Painful Urination, Urgency, Urinary frequency, Urinary Retention, Urinating at Night and Weak urinary stream. Musculoskeletal Present- Morning Stiffness. Not Present- Back Pain, Joint Pain, Joint Swelling, Muscle Pain, Muscle Weakness and Spasms. Neurological Not Present- Blackout spells, Difficulty with balance, Dizziness, Paralysis, Tremor and Weakness. Psychiatric Not Present- Insomnia.  Vitals Weight: 165 lb Height: 62in Weight was reported by  patient. Height was reported by patient. Body Surface Area: 1.76 m Body Mass Index: 30.18 kg/m  Pulse: 68 (Regular)  BP: 124/80 (Sitting, Right Arm, Standard)  Physical Exam General Mental Status -Alert, cooperative and good historian. General Appearance-pleasant, Not in acute distress. Orientation-Oriented X3. Build & Nutrition-Well nourished and Well developed.  Head and Neck Head-normocephalic, atraumatic . Neck Global Assessment - supple, no bruit auscultated on the right, no bruit auscultated on the left.  Eye Vision-Wears corrective lenses. Pupil - Bilateral-Regular and Round. Motion - Bilateral-EOMI.  Chest and Lung Exam Auscultation Breath sounds - clear at anterior chest wall and clear at posterior chest wall. Adventitious sounds - No Adventitious sounds.  Cardiovascular Auscultation Rhythm - Regular rate and rhythm. Heart Sounds - S1 WNL and S2 WNL. Murmurs & Other Heart Sounds - Auscultation of the heart reveals - No Murmurs.  Abdomen Palpation/Percussion Tenderness - Abdomen is non-tender to palpation. Rigidity (guarding) - Abdomen is soft. Auscultation Auscultation of the abdomen reveals - Bowel sounds normal.  Female Genitourinary Note: Not done, not pertinent to present illness   Musculoskeletal Note: Well-developed female, in no distress. Evaluation of her left hip, normal range of motion with no discomfort. There is no tenderness about the left hip. Right hip flexion to about 100. Minimal internal rotation, about 20 of external rotation, 20 of abduction. She walks with a significantly antalgic gait pattern. Knee exam is normal. Pulse, sensation and motor intact both lower extremities.  RADIOGRAPHS AP pelvis and lateral of the right hip show bone on bone arthritis of the right hip with subchondral cystic formation. Left hip has slight change. She does have dysplastic changes especially in the right acetabulum.  Assessment &  Plan Primary osteoarthritis of right hip (M16.11)  Note:Surgical Plans: Right Total Hip Replacement - Anterior Approach  Disposition: Home  PCP: Dr. Andria Frames - Patient has been seen preoperatively and felt to  be stable for surgery.  IV TXA  Anesthesia Issues: Nausea with several surgeries  Signed electronically by Ok Edwards, III PA-C

## 2016-10-23 ENCOUNTER — Inpatient Hospital Stay (HOSPITAL_COMMUNITY): Payer: Commercial Managed Care - PPO

## 2016-10-23 ENCOUNTER — Inpatient Hospital Stay (HOSPITAL_COMMUNITY)
Admission: RE | Admit: 2016-10-23 | Discharge: 2016-10-25 | DRG: 470 | Disposition: A | Discharge status: Home Health | Payer: Commercial Managed Care - PPO | Source: Ambulatory Visit | Attending: Orthopedic Surgery | Admitting: Orthopedic Surgery

## 2016-10-23 ENCOUNTER — Encounter (HOSPITAL_COMMUNITY)
Admission: RE | Disposition: A | Discharge status: Home Health | Payer: Self-pay | Source: Ambulatory Visit | Attending: Orthopedic Surgery

## 2016-10-23 ENCOUNTER — Inpatient Hospital Stay (HOSPITAL_COMMUNITY): Payer: Commercial Managed Care - PPO | Admitting: Anesthesiology

## 2016-10-23 ENCOUNTER — Encounter (HOSPITAL_COMMUNITY): Payer: Self-pay | Admitting: *Deleted

## 2016-10-23 DIAGNOSIS — G8929 Other chronic pain: Secondary | ICD-10-CM | POA: Diagnosis present

## 2016-10-23 DIAGNOSIS — G4733 Obstructive sleep apnea (adult) (pediatric): Secondary | ICD-10-CM | POA: Diagnosis present

## 2016-10-23 DIAGNOSIS — M5090 Cervical disc disorder, unspecified, unspecified cervical region: Secondary | ICD-10-CM | POA: Diagnosis present

## 2016-10-23 DIAGNOSIS — M25519 Pain in unspecified shoulder: Secondary | ICD-10-CM | POA: Diagnosis present

## 2016-10-23 DIAGNOSIS — M25551 Pain in right hip: Secondary | ICD-10-CM | POA: Diagnosis present

## 2016-10-23 DIAGNOSIS — D352 Benign neoplasm of pituitary gland: Secondary | ICD-10-CM | POA: Diagnosis present

## 2016-10-23 DIAGNOSIS — Z79899 Other long term (current) drug therapy: Secondary | ICD-10-CM | POA: Diagnosis not present

## 2016-10-23 DIAGNOSIS — K649 Unspecified hemorrhoids: Secondary | ICD-10-CM | POA: Diagnosis present

## 2016-10-23 DIAGNOSIS — E220 Acromegaly and pituitary gigantism: Secondary | ICD-10-CM | POA: Diagnosis present

## 2016-10-23 DIAGNOSIS — M169 Osteoarthritis of hip, unspecified: Secondary | ICD-10-CM | POA: Diagnosis present

## 2016-10-23 DIAGNOSIS — M1611 Unilateral primary osteoarthritis, right hip: Principal | ICD-10-CM | POA: Diagnosis present

## 2016-10-23 DIAGNOSIS — E669 Obesity, unspecified: Secondary | ICD-10-CM | POA: Diagnosis present

## 2016-10-23 DIAGNOSIS — Z6830 Body mass index (BMI) 30.0-30.9, adult: Secondary | ICD-10-CM

## 2016-10-23 DIAGNOSIS — Z96649 Presence of unspecified artificial hip joint: Secondary | ICD-10-CM

## 2016-10-23 HISTORY — PX: TOTAL HIP ARTHROPLASTY: SHX124

## 2016-10-23 LAB — TYPE AND SCREEN
ABO/RH(D): O POS
Antibody Screen: NEGATIVE

## 2016-10-23 SURGERY — ARTHROPLASTY, HIP, TOTAL, ANTERIOR APPROACH
Anesthesia: Spinal | Site: Hip | Laterality: Right

## 2016-10-23 MED ORDER — MENTHOL 3 MG MT LOZG: 1.0000 | LOZENGE | OROMUCOSAL | Status: DC | PRN

## 2016-10-23 MED ORDER — FLEET ENEMA 7-19 GM/118ML RE ENEM: 1.0000 | ENEMA | Freq: Once | RECTAL | Status: DC | PRN

## 2016-10-23 MED ORDER — EPHEDRINE SULFATE 50 MG/ML IJ SOLN
INTRAMUSCULAR | Status: DC | PRN
  Administered 2016-10-23: 10 mg via INTRAVENOUS
  Administered 2016-10-23: 5 mg via INTRAVENOUS

## 2016-10-23 MED ORDER — DIPHENHYDRAMINE HCL 12.5 MG/5ML PO ELIX: 12.5000 mg | ORAL_SOLUTION | ORAL | Status: DC | PRN

## 2016-10-23 MED ORDER — CHLORHEXIDINE GLUCONATE 4 % EX LIQD: 60.0000 mL | Freq: Once | CUTANEOUS | Status: DC

## 2016-10-23 MED ORDER — DEXAMETHASONE SODIUM PHOSPHATE 10 MG/ML IJ SOLN
10.0000 mg | Freq: Once | INTRAMUSCULAR | Status: AC
  Administered 2016-10-23: 10 mg via INTRAVENOUS

## 2016-10-23 MED ORDER — ROCURONIUM BROMIDE 50 MG/5ML IV SOSY
PREFILLED_SYRINGE | INTRAVENOUS | Status: AC
  Filled 2016-10-23: qty 5

## 2016-10-23 MED ORDER — PROPOFOL 10 MG/ML IV BOLUS
INTRAVENOUS | Status: AC
  Filled 2016-10-23: qty 40

## 2016-10-23 MED ORDER — CEFAZOLIN SODIUM-DEXTROSE 2-4 GM/100ML-% IV SOLN
2.0000 g | INTRAVENOUS | Status: AC
  Administered 2016-10-23: 2 g via INTRAVENOUS
  Filled 2016-10-23: qty 100

## 2016-10-23 MED ORDER — SUCCINYLCHOLINE CHLORIDE 200 MG/10ML IV SOSY
PREFILLED_SYRINGE | INTRAVENOUS | Status: DC | PRN
  Administered 2016-10-23: 100 mg via INTRAVENOUS

## 2016-10-23 MED ORDER — CEFAZOLIN SODIUM-DEXTROSE 2-4 GM/100ML-% IV SOLN
INTRAVENOUS | Status: AC
  Filled 2016-10-23: qty 100

## 2016-10-23 MED ORDER — TRANEXAMIC ACID 1000 MG/10ML IV SOLN
1000.0000 mg | INTRAVENOUS | Status: AC
  Administered 2016-10-23: 1000 mg via INTRAVENOUS
  Filled 2016-10-23: qty 1100

## 2016-10-23 MED ORDER — EPHEDRINE 5 MG/ML INJ
INTRAVENOUS | Status: AC
  Filled 2016-10-23: qty 10

## 2016-10-23 MED ORDER — MIDAZOLAM HCL 5 MG/5ML IJ SOLN
INTRAMUSCULAR | Status: DC | PRN
  Administered 2016-10-23 (×2): 1 mg via INTRAVENOUS

## 2016-10-23 MED ORDER — CEFAZOLIN SODIUM-DEXTROSE 2-4 GM/100ML-% IV SOLN
2.0000 g | Freq: Four times a day (QID) | INTRAVENOUS | Status: AC
  Administered 2016-10-23 (×2): 2 g via INTRAVENOUS
  Filled 2016-10-23 (×2): qty 100

## 2016-10-23 MED ORDER — FENTANYL CITRATE (PF) 100 MCG/2ML IJ SOLN: 25.0000 ug | INTRAMUSCULAR | Status: DC | PRN

## 2016-10-23 MED ORDER — ONDANSETRON HCL 4 MG/2ML IJ SOLN
INTRAMUSCULAR | Status: AC
  Filled 2016-10-23: qty 2

## 2016-10-23 MED ORDER — HYDROMORPHONE HCL 1 MG/ML IJ SOLN
INTRAMUSCULAR | Status: AC
  Filled 2016-10-23: qty 1

## 2016-10-23 MED ORDER — METOCLOPRAMIDE HCL 5 MG/ML IJ SOLN: 10.0000 mg | Freq: Once | INTRAMUSCULAR | Status: DC | PRN

## 2016-10-23 MED ORDER — DEXAMETHASONE SODIUM PHOSPHATE 10 MG/ML IJ SOLN
INTRAMUSCULAR | Status: AC
  Filled 2016-10-23: qty 1

## 2016-10-23 MED ORDER — MIDAZOLAM HCL 2 MG/2ML IJ SOLN
INTRAMUSCULAR | Status: AC
  Filled 2016-10-23: qty 2

## 2016-10-23 MED ORDER — DEXAMETHASONE SODIUM PHOSPHATE 10 MG/ML IJ SOLN
10.0000 mg | Freq: Once | INTRAMUSCULAR | Status: AC
  Administered 2016-10-24: 10 mg via INTRAVENOUS
  Filled 2016-10-23: qty 1

## 2016-10-23 MED ORDER — PROPOFOL 10 MG/ML IV BOLUS
INTRAVENOUS | Status: DC | PRN
  Administered 2016-10-23: 150 mg via INTRAVENOUS

## 2016-10-23 MED ORDER — MEPERIDINE HCL 50 MG/ML IJ SOLN: 6.2500 mg | INTRAMUSCULAR | Status: DC | PRN

## 2016-10-23 MED ORDER — DEXTROSE 5 % IV SOLN
500.0000 mg | Freq: Four times a day (QID) | INTRAVENOUS | Status: DC | PRN
  Administered 2016-10-23: 500 mg via INTRAVENOUS
  Filled 2016-10-23: qty 550
  Filled 2016-10-23 (×2): qty 5

## 2016-10-23 MED ORDER — METHOCARBAMOL 500 MG PO TABS
500.0000 mg | ORAL_TABLET | Freq: Four times a day (QID) | ORAL | Status: DC | PRN
  Administered 2016-10-24 – 2016-10-25 (×3): 500 mg via ORAL
  Filled 2016-10-23 (×3): qty 1

## 2016-10-23 MED ORDER — SCOPOLAMINE 1 MG/3DAYS TD PT72
MEDICATED_PATCH | TRANSDERMAL | Status: AC
  Filled 2016-10-23: qty 1

## 2016-10-23 MED ORDER — ACETAMINOPHEN 500 MG PO TABS
1000.0000 mg | ORAL_TABLET | Freq: Four times a day (QID) | ORAL | Status: AC
  Administered 2016-10-23 – 2016-10-24 (×4): 1000 mg via ORAL
  Filled 2016-10-23 (×4): qty 2

## 2016-10-23 MED ORDER — SUCCINYLCHOLINE CHLORIDE 200 MG/10ML IV SOSY
PREFILLED_SYRINGE | INTRAVENOUS | Status: AC
  Filled 2016-10-23: qty 10

## 2016-10-23 MED ORDER — SCOPOLAMINE 1 MG/3DAYS TD PT72
1.0000 | MEDICATED_PATCH | TRANSDERMAL | Status: DC
  Administered 2016-10-23: 1.5 mg via TRANSDERMAL

## 2016-10-23 MED ORDER — FENTANYL CITRATE (PF) 100 MCG/2ML IJ SOLN
INTRAMUSCULAR | Status: AC
  Filled 2016-10-23: qty 2

## 2016-10-23 MED ORDER — POLYETHYLENE GLYCOL 3350 17 G PO PACK
17.0000 g | PACK | Freq: Every day | ORAL | Status: DC | PRN
  Filled 2016-10-23: qty 1

## 2016-10-23 MED ORDER — ACETAMINOPHEN 650 MG RE SUPP: 650.0000 mg | Freq: Four times a day (QID) | RECTAL | Status: DC | PRN

## 2016-10-23 MED ORDER — LACTATED RINGERS IV SOLN
INTRAVENOUS | Status: DC
  Administered 2016-10-23: 1000 mL via INTRAVENOUS
  Administered 2016-10-23: 12:00:00 via INTRAVENOUS

## 2016-10-23 MED ORDER — LIDOCAINE 2% (20 MG/ML) 5 ML SYRINGE
INTRAMUSCULAR | Status: DC | PRN
  Administered 2016-10-23: 80 mg via INTRAVENOUS

## 2016-10-23 MED ORDER — ONDANSETRON HCL 4 MG/2ML IJ SOLN
4.0000 mg | Freq: Four times a day (QID) | INTRAMUSCULAR | Status: DC | PRN
  Administered 2016-10-23 – 2016-10-24 (×3): 4 mg via INTRAVENOUS
  Filled 2016-10-23 (×3): qty 2

## 2016-10-23 MED ORDER — TRANEXAMIC ACID 1000 MG/10ML IV SOLN
1000.0000 mg | Freq: Once | INTRAVENOUS | Status: AC
  Administered 2016-10-23: 1000 mg via INTRAVENOUS
  Filled 2016-10-23: qty 10

## 2016-10-23 MED ORDER — PHENYLEPHRINE 40 MCG/ML (10ML) SYRINGE FOR IV PUSH (FOR BLOOD PRESSURE SUPPORT)
PREFILLED_SYRINGE | INTRAVENOUS | Status: AC
  Filled 2016-10-23: qty 10

## 2016-10-23 MED ORDER — ACETAMINOPHEN 10 MG/ML IV SOLN
1000.0000 mg | Freq: Once | INTRAVENOUS | Status: AC
  Administered 2016-10-23: 1000 mg via INTRAVENOUS
  Filled 2016-10-23: qty 100

## 2016-10-23 MED ORDER — BISACODYL 10 MG RE SUPP: 10.0000 mg | Freq: Every day | RECTAL | Status: DC | PRN

## 2016-10-23 MED ORDER — BUPIVACAINE HCL (PF) 0.25 % IJ SOLN
INTRAMUSCULAR | Status: DC | PRN
  Administered 2016-10-23: 30 mL

## 2016-10-23 MED ORDER — PHENOL 1.4 % MT LIQD: 1.0000 | OROMUCOSAL | Status: DC | PRN

## 2016-10-23 MED ORDER — MIDAZOLAM HCL 2 MG/2ML IJ SOLN
1.0000 mg | Freq: Once | INTRAMUSCULAR | Status: AC
  Administered 2016-10-23: 1 mg via INTRAVENOUS

## 2016-10-23 MED ORDER — PHENYLEPHRINE HCL 10 MG/ML IJ SOLN
INTRAMUSCULAR | Status: DC | PRN
  Administered 2016-10-23 (×4): 80 ug via INTRAVENOUS

## 2016-10-23 MED ORDER — HYDROMORPHONE HCL 2 MG PO TABS
2.0000 mg | ORAL_TABLET | ORAL | Status: DC | PRN
  Administered 2016-10-24 – 2016-10-25 (×5): 2 mg via ORAL
  Filled 2016-10-23 (×5): qty 1

## 2016-10-23 MED ORDER — ACETAMINOPHEN 10 MG/ML IV SOLN
INTRAVENOUS | Status: AC
  Filled 2016-10-23: qty 100

## 2016-10-23 MED ORDER — SODIUM CHLORIDE 0.9 % IV SOLN
INTRAVENOUS | Status: DC
  Administered 2016-10-23: 16:00:00 via INTRAVENOUS

## 2016-10-23 MED ORDER — ONDANSETRON HCL 4 MG PO TABS: 4.0000 mg | ORAL_TABLET | Freq: Four times a day (QID) | ORAL | Status: DC | PRN

## 2016-10-23 MED ORDER — ACETAMINOPHEN 325 MG PO TABS
650.0000 mg | ORAL_TABLET | Freq: Four times a day (QID) | ORAL | Status: DC | PRN
  Administered 2016-10-25: 650 mg via ORAL
  Filled 2016-10-23: qty 2

## 2016-10-23 MED ORDER — PHENYLEPHRINE 40 MCG/ML (10ML) SYRINGE FOR IV PUSH (FOR BLOOD PRESSURE SUPPORT)
PREFILLED_SYRINGE | INTRAVENOUS | Status: AC
  Filled 2016-10-23: qty 20

## 2016-10-23 MED ORDER — TRAMADOL HCL 50 MG PO TABS: 50.0000 mg | ORAL_TABLET | Freq: Four times a day (QID) | ORAL | Status: DC | PRN

## 2016-10-23 MED ORDER — BUPIVACAINE HCL (PF) 0.25 % IJ SOLN
INTRAMUSCULAR | Status: AC
  Filled 2016-10-23: qty 30

## 2016-10-23 MED ORDER — FENTANYL CITRATE (PF) 100 MCG/2ML IJ SOLN
INTRAMUSCULAR | Status: DC | PRN
  Administered 2016-10-23 (×4): 50 ug via INTRAVENOUS
  Administered 2016-10-23: 100 ug via INTRAVENOUS
  Administered 2016-10-23 (×2): 50 ug via INTRAVENOUS

## 2016-10-23 MED ORDER — SUGAMMADEX SODIUM 200 MG/2ML IV SOLN
INTRAVENOUS | Status: AC
  Filled 2016-10-23: qty 2

## 2016-10-23 MED ORDER — PHENYLEPHRINE 40 MCG/ML (10ML) SYRINGE FOR IV PUSH (FOR BLOOD PRESSURE SUPPORT)
PREFILLED_SYRINGE | INTRAVENOUS | Status: AC
Start: 2016-10-23 — End: 2016-10-23
  Filled 2016-10-23: qty 10

## 2016-10-23 MED ORDER — PROPOFOL 10 MG/ML IV BOLUS
INTRAVENOUS | Status: AC
  Filled 2016-10-23: qty 20

## 2016-10-23 MED ORDER — METOCLOPRAMIDE HCL 5 MG/ML IJ SOLN
5.0000 mg | Freq: Three times a day (TID) | INTRAMUSCULAR | Status: DC | PRN
Start: 2016-10-23 — End: 2016-10-25
  Administered 2016-10-23 – 2016-10-24 (×2): 10 mg via INTRAVENOUS
  Filled 2016-10-23 (×3): qty 2

## 2016-10-23 MED ORDER — ROCURONIUM BROMIDE 50 MG/5ML IV SOSY
PREFILLED_SYRINGE | INTRAVENOUS | Status: DC | PRN
  Administered 2016-10-23: 20 mg via INTRAVENOUS

## 2016-10-23 MED ORDER — HYDROMORPHONE HCL 1 MG/ML IJ SOLN
0.2500 mg | INTRAMUSCULAR | Status: DC | PRN
  Administered 2016-10-23 (×4): 0.5 mg via INTRAVENOUS

## 2016-10-23 MED ORDER — RIVAROXABAN 10 MG PO TABS
10.0000 mg | ORAL_TABLET | Freq: Every day | ORAL | Status: DC
  Administered 2016-10-24 – 2016-10-25 (×2): 10 mg via ORAL
  Filled 2016-10-23 (×2): qty 1

## 2016-10-23 MED ORDER — DOCUSATE SODIUM 100 MG PO CAPS
100.0000 mg | ORAL_CAPSULE | Freq: Two times a day (BID) | ORAL | Status: DC
  Administered 2016-10-23 – 2016-10-25 (×4): 100 mg via ORAL
  Filled 2016-10-23 (×4): qty 1

## 2016-10-23 MED ORDER — METOCLOPRAMIDE HCL 5 MG PO TABS: 5.0000 mg | ORAL_TABLET | Freq: Three times a day (TID) | ORAL | Status: DC | PRN

## 2016-10-23 MED ORDER — HYDROMORPHONE HCL 1 MG/ML IJ SOLN
0.5000 mg | INTRAMUSCULAR | Status: DC | PRN
  Administered 2016-10-23 – 2016-10-24 (×2): 0.5 mg via INTRAVENOUS
  Filled 2016-10-23 (×2): qty 0.5

## 2016-10-23 MED ORDER — SUGAMMADEX SODIUM 200 MG/2ML IV SOLN
INTRAVENOUS | Status: DC | PRN
  Administered 2016-10-23: 150 mg via INTRAVENOUS

## 2016-10-23 MED ORDER — ONDANSETRON HCL 4 MG/2ML IJ SOLN
INTRAMUSCULAR | Status: DC | PRN
  Administered 2016-10-23 (×2): 4 mg via INTRAVENOUS

## 2016-10-23 SURGICAL SUPPLY — 33 items
BAG DECANTER FOR FLEXI CONT (MISCELLANEOUS) IMPLANT
BAG ZIPLOCK 12X15 (MISCELLANEOUS) IMPLANT
BLADE SAG 18X100X1.27 (BLADE) ×2 IMPLANT
CAPT HIP TOTAL 2 ×2 IMPLANT
CLOTH BEACON ORANGE TIMEOUT ST (SAFETY) ×2 IMPLANT
COVER PERINEAL POST (MISCELLANEOUS) ×2 IMPLANT
DECANTER SPIKE VIAL GLASS SM (MISCELLANEOUS) ×2 IMPLANT
DRAPE STERI IOBAN 125X83 (DRAPES) ×2 IMPLANT
DRAPE U-SHAPE 47X51 STRL (DRAPES) ×4 IMPLANT
DRSG ADAPTIC 3X8 NADH LF (GAUZE/BANDAGES/DRESSINGS) ×2 IMPLANT
DRSG MEPILEX BORDER 4X4 (GAUZE/BANDAGES/DRESSINGS) ×2 IMPLANT
DRSG MEPILEX BORDER 4X8 (GAUZE/BANDAGES/DRESSINGS) ×2 IMPLANT
DURAPREP 26ML APPLICATOR (WOUND CARE) ×2 IMPLANT
ELECT REM PT RETURN 9FT ADLT (ELECTROSURGICAL) ×2
ELECTRODE REM PT RTRN 9FT ADLT (ELECTROSURGICAL) ×1 IMPLANT
EVACUATOR 1/8 PVC DRAIN (DRAIN) ×2 IMPLANT
GLOVE BIO SURGEON STRL SZ7.5 (GLOVE) ×2 IMPLANT
GLOVE BIO SURGEON STRL SZ8 (GLOVE) ×2 IMPLANT
GLOVE BIOGEL PI IND STRL 8 (GLOVE) ×2 IMPLANT
GLOVE BIOGEL PI INDICATOR 8 (GLOVE) ×2
GOWN STRL REUS W/TWL LRG LVL3 (GOWN DISPOSABLE) ×2 IMPLANT
GOWN STRL REUS W/TWL XL LVL3 (GOWN DISPOSABLE) ×2 IMPLANT
PACK ANTERIOR HIP CUSTOM (KITS) ×2 IMPLANT
STRIP CLOSURE SKIN 1/2X4 (GAUZE/BANDAGES/DRESSINGS) ×2 IMPLANT
SUT ETHIBOND NAB CT1 #1 30IN (SUTURE) ×2 IMPLANT
SUT MNCRL AB 4-0 PS2 18 (SUTURE) ×2 IMPLANT
SUT VIC AB 2-0 CT1 27 (SUTURE) ×3
SUT VIC AB 2-0 CT1 TAPERPNT 27 (SUTURE) ×3 IMPLANT
SUT VLOC 180 0 24IN GS25 (SUTURE) ×2 IMPLANT
SYR 50ML LL SCALE MARK (SYRINGE) IMPLANT
TRAY FOLEY CATH 14FRSI W/METER (CATHETERS) ×2 IMPLANT
TRAY FOLEY W/METER SILVER 16FR (SET/KITS/TRAYS/PACK) IMPLANT
YANKAUER SUCT BULB TIP 10FT TU (MISCELLANEOUS) ×2 IMPLANT

## 2016-10-23 NOTE — Anesthesia Postprocedure Evaluation (Signed)
Anesthesia Post Note  Patient: [name]  Procedure(s) Performed: Procedure(s) (LRB): RIGHT TOTAL HIP ARTHROPLASTY ANTERIOR APPROACH (Right)  Patient location during evaluation: PACU Anesthesia Type: Spinal Level of consciousness: awake and alert and oriented Pain management: pain level controlled Vital Signs Assessment: post-procedure vital signs reviewed and stable Respiratory status: spontaneous breathing, nonlabored ventilation, respiratory function stable and patient connected to nasal cannula oxygen Cardiovascular status: blood pressure returned to baseline and stable Postop Assessment: no signs of nausea or vomiting Anesthetic complications: no       Last Vitals:  Vitals:   10/23/16 1228 10/23/16 1230  BP: (!) 157/89 (!) 142/87  Pulse: (!) 103 (!) 101  Resp: 12 12  Temp: 36.9 C     Last Pain:  Vitals:   10/23/16 1300  TempSrc:   PainSc: 5                  Clatie Kessen A.

## 2016-10-23 NOTE — Anesthesia Preprocedure Evaluation (Addendum)
Anesthesia Evaluation  Patient identified by MRN, date of birth, ID band Patient awake    Reviewed: Allergy & Precautions, NPO status , Patient's Chart, lab work & pertinent test results  History of Anesthesia Complications (+) PONV and history of anesthetic complications  Airway Mallampati: III  TM Distance: >3 FB Neck ROM: Full    Dental no notable dental hx. (+) Teeth Intact   Pulmonary sleep apnea ,    Pulmonary exam normal breath sounds clear to auscultation       Cardiovascular negative cardio ROS Normal cardiovascular exam Rhythm:Regular Rate:Normal     Neuro/Psych negative neurological ROS  negative psych ROS   GI/Hepatic negative GI ROS, Neg liver ROS,   Endo/Other  Hx/o pituitary adenoma - S/P resection 2013 Acromegaly Obesity  Renal/GU negative Renal ROS  negative genitourinary   Musculoskeletal  (+) Arthritis , Osteoarthritis,  OA right hip DDD lumbar spine   Abdominal   Peds  Hematology negative hematology ROS (+)   Anesthesia Other Findings   Reproductive/Obstetrics                               Chemistry      Component Value Date/Time   NA 141 10/17/2016 0900   K 4.3 10/17/2016 0900   CL 104 10/17/2016 0900   CO2 30 10/17/2016 0900   BUN 21 (H) 10/17/2016 0900   CREATININE 0.87 10/17/2016 0900      Component Value Date/Time   CALCIUM 9.5 10/17/2016 0900   ALKPHOS 63 10/17/2016 0900   AST 20 10/17/2016 0900   ALT 17 10/17/2016 0900   BILITOT 0.5 10/17/2016 0900     Lab Results  Component Value Date   WBC 7.0 10/17/2016   HGB 14.3 10/17/2016   HCT 42.8 10/17/2016   MCV 90.7 10/17/2016   PLT 276 10/17/2016    Anesthesia Physical Anesthesia Plan  ASA: II  Anesthesia Plan: General   Post-op Pain Management:    Induction:   Airway Management Planned: Oral ETT  Additional Equipment:   Intra-op Plan:   Post-operative Plan: Extubation in  OR  Informed Consent: I have reviewed the patients History and Physical, chart, labs and discussed the procedure including the risks, benefits and alternatives for the proposed anesthesia with the patient or authorized representative who has indicated his/her understanding and acceptance.   Dental advisory given  Plan Discussed with: Anesthesiologist, CRNA and Surgeon  Anesthesia Plan Comments:        Anesthesia Quick Evaluation

## 2016-10-23 NOTE — Anesthesia Procedure Notes (Addendum)
Procedure Name: Intubation Date/Time: 10/23/2016 10:43 AM Performed by: West Pugh Pre-anesthesia Checklist: Patient identified, Emergency Drugs available, Suction available, Patient being monitored and Timeout performed Patient Re-evaluated:Patient Re-evaluated prior to inductionOxygen Delivery Method: Circle system utilized Preoxygenation: Pre-oxygenation with 100% oxygen Intubation Type: IV induction, Rapid sequence and Cricoid Pressure applied Laryngoscope Size: Mac and 3 Grade View: Grade III Tube type: Oral Number of attempts: 1 Airway Equipment and Method: Stylet Placement Confirmation: ETT inserted through vocal cords under direct vision,  positive ETCO2,  CO2 detector and breath sounds checked- equal and bilateral Secured at: 21 cm Tube secured with: Tape Dental Injury: Teeth and Oropharynx as per pre-operative assessment  Comments: During direct laryngoscopy, head and neck maintained in neutral alignment with spine.

## 2016-10-23 NOTE — H&P (View-Only) (Signed)
[name] DOB: 15-Mar-1961 Married / Language: English / Race: White Female Date of Admission:  10/23/2016 CC:  Right hip pain History of Present Illness  The patient is a 56 year old female who comes in for a preoperative History and Physical. The patient is scheduled for a right total hip arthroplasty (anterior) to be performed by Dr. Dione Plover. Aluisio, MD at [facility] on 10-23-2016. The patient is a 56 year old female who presented with a hip problem. The patient reports right hip problems including pain symptoms that have been present for 5 year(s). The symptoms began without any known injury. Symptoms reported include hip pain, pain with weightbearing, night pain, popping, grinding, numbness, tingling and catching The patient reports symptoms radiating to the: right groin, right thigh, right thigh anteriorly, right thigh posteriorly, right knee and right calf. The patient describes the hip problem as sharp.The patient feels as if their symptoms are does feel they are worsening. Symptoms are exacerbated by movement, weight bearing, sitting and lying on the affected side. Current treatment includes application of ice, application of heat and nonsteroidal anti-inflammatory drugs (Mobic prn). Previous workup for this problem has included pelvic x-rays and hip x-rays. The hip problems have been going on for quite a while now, getting progressively worse over time. Pain is in her groin, anterior thigh. It is limiting what she can and cannot do. She is at a stage now that hip has essentially taken over her life. She is not having any left hip pain. She is not having any lower back pain, lower extremity weakness or paresthesias with this. AP pelvis and lateral of the right hip show bone on bone arthritis of the right hip with subchondral cystic formation. Left hip has slight change. She does have dysplastic changes especially in the right acetabulum. She has got advanced arthritic change  in the right hip, most likely secondary to dysplasia. At this point, treatment options would include injection versus hip replacement. At this point, the most predictable means of improving pain and function is total hip arthroplasty. She is at a stage where she wants to proceed with surgery. They have been treated conservatively in the past for the above stated problem and despite conservative measures, they continue to have progressive pain and severe functional limitations and dysfunction. They have failed non-operative management including home exercise, medications. It is felt that they would benefit from undergoing total joint replacement. Risks and benefits of the procedure have been discussed with the patient and they elect to proceed with surgery. There are no active contraindications to surgery such as ongoing infection or rapidly progressive neurological disease.  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  Family History Cancer  grandmother mothers side, grandfather mothers side and grandmother fathers side Depression  sister Drug / Alcohol Addiction  brother Heart Disease  mother, father and grandfather mothers side Heart disease in female family member before age 71  Heart disease in female family member before age 46   Social History Alcohol use  current drinker; only occasionally per week Children  1 Current work status  unemployed Drug/Alcohol Rehab (Currently)  no Drug/Alcohol Rehab (Previously)  no Exercise  Exercises  rarely Illicit drug use  no Living situation  live with spouse Marital status  married Number of flights of stairs before winded  4-5 Pain Contract  no Tobacco use  never smoker  Medication History  Meloxicam (15MG  Tablet, Oral) Active. Premarin (0.625MG /GM Cream, Vaginal) Active. Glucosamine Chondr 500 Complex (Oral) Specific strength unknown - Active. Probiotic (Oral) Active. Fish Oil (1000MG  Capsule, Oral) Active. Vitamin D (Oral) Specific strength unknown - Active. Zinc (Oral) Specific strength unknown - Active.   Past Surgical History Appendectomy  Hemorrhoidectomy  Neck Disc Surgery  Rotator Cuff Repair  right    Review of Systems General Not Present- Chills, Fatigue, Fever, Memory Loss, Night Sweats, Weight Gain and Weight Loss. Skin Not Present- Eczema, Hives, Itching, Lesions and Rash. HEENT Not Present- Dentures, Double Vision, Headache, Hearing Loss, Tinnitus and Visual Loss. Respiratory Not Present- Allergies, Chronic Cough, Coughing up blood, Shortness of breath at rest and Shortness of breath with exertion. Cardiovascular Not Present- Chest Pain, Difficulty Breathing Lying Down, Murmur, Palpitations, Racing/skipping heartbeats and Swelling. Gastrointestinal Not Present- Abdominal Pain, Bloody Stool, Constipation, Diarrhea, Difficulty Swallowing, Heartburn, Jaundice, Loss of appetitie, Nausea and Vomiting. Female Genitourinary Not Present- Blood in Urine, Discharge, Flank Pain, Incontinence, Painful Urination, Urgency, Urinary frequency, Urinary Retention, Urinating at Night and Weak urinary stream. Musculoskeletal Present- Morning Stiffness. Not Present- Back Pain, Joint Pain, Joint Swelling, Muscle Pain, Muscle Weakness and Spasms. Neurological Not Present- Blackout spells, Difficulty with balance, Dizziness, Paralysis, Tremor and Weakness. Psychiatric Not Present- Insomnia.  Vitals Weight: 165 lb Height: 62in Weight was reported by  patient. Height was reported by patient. Body Surface Area: 1.76 m Body Mass Index: 30.18 kg/m  Pulse: 68 (Regular)  BP: 124/80 (Sitting, Right Arm, Standard)  Physical Exam General Mental Status -Alert, cooperative and good historian. General Appearance-pleasant, Not in acute distress. Orientation-Oriented X3. Build & Nutrition-Well nourished and Well developed.  Head and Neck Head-normocephalic, atraumatic . Neck Global Assessment - supple, no bruit auscultated on the right, no bruit auscultated on the left.  Eye Vision-Wears corrective lenses. Pupil - Bilateral-Regular and Round. Motion - Bilateral-EOMI.  Chest and Lung Exam Auscultation Breath sounds - clear at anterior chest wall and clear at posterior chest wall. Adventitious sounds - No Adventitious sounds.  Cardiovascular Auscultation Rhythm - Regular rate and rhythm. Heart Sounds - S1 WNL and S2 WNL. Murmurs & Other Heart Sounds - Auscultation of the heart reveals - No Murmurs.  Abdomen Palpation/Percussion Tenderness - Abdomen is non-tender to palpation. Rigidity (guarding) - Abdomen is soft. Auscultation Auscultation of the abdomen reveals - Bowel sounds normal.  Female Genitourinary Note: Not done, not pertinent to present illness   Musculoskeletal Note: Well-developed female, in no distress. Evaluation of her left hip, normal range of motion with no discomfort. There is no tenderness about the left hip. Right hip flexion to about 100. Minimal internal rotation, about 20 of external rotation, 20 of abduction. She walks with a significantly antalgic gait pattern. Knee exam is normal. Pulse, sensation and motor intact both lower extremities.  RADIOGRAPHS AP pelvis and lateral of the right hip show bone on bone arthritis of the right hip with subchondral cystic formation. Left hip has slight change. She does have dysplastic changes especially in the right acetabulum.  Assessment &  Plan Primary osteoarthritis of right hip (M16.11)  Note:Surgical Plans: Right Total Hip Replacement - Anterior Approach  Disposition: Home  PCP: Dr. Andria Frames - Patient has been seen preoperatively and felt to  be stable for surgery.  IV TXA  Anesthesia Issues: Nausea with several surgeries  Signed electronically by Ok Edwards, III PA-C

## 2016-10-23 NOTE — Op Note (Signed)
OPERATIVE REPORT- TOTAL HIP ARTHROPLASTY   PREOPERATIVE DIAGNOSIS: Osteoarthritis of the Right hip.   POSTOPERATIVE DIAGNOSIS: Osteoarthritis of the Right  hip.   PROCEDURE: Right total hip arthroplasty, anterior approach.   SURGEON: Gaynelle Arabian, MD   ASSISTANT: Arlee Muslim, PA-C  ANESTHESIA:  General  ESTIMATED BLOOD LOSS:-400 ml   DRAINS: Hemovac x1.   COMPLICATIONS: None   CONDITION: PACU - hemodynamically stable.   BRIEF CLINICAL NOTE: [name] is a 56 y.o. female who has advanced end-  stage arthritis of their Right  hip with progressively worsening pain and  dysfunction.The patient has failed nonoperative management and presents for  total hip arthroplasty.   PROCEDURE IN DETAIL: After successful administration of spinal  anesthetic, the traction boots for the [facility] bed were placed on both  feet and the patient was placed onto the [facility] bed, boots placed into the leg  holders. The Right hip was then isolated from the perineum with plastic  drapes and prepped and draped in the usual sterile fashion. ASIS and  greater trochanter were marked and a oblique incision was made, starting  at about 1 cm lateral and 2 cm distal to the ASIS and coursing towards  the anterior cortex of the femur. The skin was cut with a 10 blade  through subcutaneous tissue to the level of the fascia overlying the  tensor fascia lata muscle. The fascia was then incised in line with the  incision at the junction of the anterior third and posterior 2/3rd. The  muscle was teased off the fascia and then the interval between the TFL  and the rectus was developed. The Hohmann retractor was then placed at  the top of the femoral neck over the capsule. The vessels overlying the  capsule were cauterized and the fat on top of the capsule was removed.  A Hohmann retractor was then placed anterior underneath the rectus  femoris to give exposure to the entire anterior capsule. A  T-shaped  capsulotomy was performed. The edges were tagged and the femoral head  was identified.       Osteophytes are removed off the superior acetabulum.  The femoral neck was then cut in situ with an oscillating saw. Traction  was then applied to the left lower extremity utilizing the [facility]  traction. The femoral head was then removed. Retractors were placed  around the acetabulum and then circumferential removal of the labrum was  performed. Osteophytes were also removed. Reaming starts at 45 mm to  medialize and  Increased in 2 mm increments to 47 mm. We reamed in  approximately 40 degrees of abduction, 20 degrees anteversion. A 48 mm  pinnacle acetabular shell was then impacted in anatomic position under  fluoroscopic guidance with excellent purchase. We did not need to place  any additional dome screws. A 28 mm neutral + 4 marathon liner was then  placed into the acetabular shell.       The femoral lift was then placed along the lateral aspect of the femur  just distal to the vastus ridge. The leg was  externally rotated and capsule  was stripped off the inferior aspect of the femoral neck down to the  level of the lesser trochanter, this was done with electrocautery. The femur was lifted after this was performed. The  leg was then placed in an extended and adducted position essentially delivering the femur. We also removed the capsule superiorly and the piriformis from the piriformis fossa  to gain excellent exposure of the  proximal femur. Rongeur was used to remove some cancellous bone to get  into the lateral portion of the proximal femur for placement of the  initial starter reamer. The starter broaches was placed  the starter broach  and was shown to go down the center of the canal. Broaching  with the  Corail system was then performed starting at size 8, coursing  Up to size 10. A size 10 had excellent torsional and rotational  and axial stability. The trial standard offset neck  was then placed  with a 28 + 1.5 trial head. The hip was then reduced. We confirmed that  the stem was in the canal both on AP and lateral x-rays. It also has excellent sizing. The hip was reduced with outstanding stability through full extension and full external rotation.. AP pelvis was taken and the leg lengths were measured and found to be equal. Hip was then dislocated again and the femoral head and neck removed. The  femoral broach was removed. Size 10 Corail stem with a standard offset  neck was then impacted into the femur following native anteversion. Has  excellent purchase in the canal. Excellent torsional and rotational and  axial stability. It is confirmed to be in the canal on AP and lateral  fluoroscopic views. The 28 + 1.5 ceramic head was placed and the hip  reduced with outstanding stability. Again AP pelvis was taken and it  confirmed that the leg lengths were equal. The wound was then copiously  irrigated with saline solution and the capsule reattached and repaired  with Ethibond suture. 30 ml of .25% Bupivicaine was  injected into the capsule and into the edge of the tensor fascia lata as well as subcutaneous tissue. The fascia overlying the tensor fascia lata was then closed with a running #1 V-Loc. Subcu was closed with interrupted 2-0 Vicryl and subcuticular running 4-0 Monocryl. Incision was cleaned  and dried. Steri-Strips and a bulky sterile dressing applied. Hemovac  drain was hooked to suction and then the patient was awakened and transported to  recovery in stable condition.        Please note that a surgical assistant was a medical necessity for this procedure to perform it in a safe and expeditious manner. Assistant was necessary to provide appropriate retraction of vital neurovascular structures and to prevent femoral fracture and allow for anatomic placement of the prosthesis.  Gaynelle Arabian, M.D.

## 2016-10-23 NOTE — Transfer of Care (Signed)
Immediate Anesthesia Transfer of Care Note  Patient: [name]  Procedure(s) Performed: Procedure(s): RIGHT TOTAL HIP ARTHROPLASTY ANTERIOR APPROACH (Right)  Patient Location: PACU  Anesthesia Type:General  Level of Consciousness:  sedated, patient cooperative and responds to stimulation  Airway & Oxygen Therapy:Patient Spontanous Breathing and Patient connected to face mask oxgen  Post-op Assessment:  Report given to PACU RN and Post -op Vital signs reviewed and stable  Post vital signs:  Reviewed and stable  Last Vitals:  Vitals:   10/23/16 0824  BP: 135/72  Pulse: 73  Resp: 16  Temp: 123456 C    Complications: No apparent anesthesia complications

## 2016-10-23 NOTE — Interval H&P Note (Signed)
History and Physical Interval Note:  10/23/2016 9:50 AM  [name]  has presented today for surgery, with the diagnosis of RIGHT HIP OA  The various methods of treatment have been discussed with the patient and family. After consideration of risks, benefits and other options for treatment, the patient has consented to  Procedure(s): RIGHT TOTAL HIP ARTHROPLASTY ANTERIOR APPROACH (Right) as a surgical intervention .  The patient's history has been reviewed, patient examined, no change in status, stable for surgery.  I have reviewed the patient's chart and labs.  Questions were answered to the patient's satisfaction.     Gearlean Alf

## 2016-10-24 ENCOUNTER — Encounter (HOSPITAL_COMMUNITY): Payer: Self-pay | Admitting: Orthopedic Surgery

## 2016-10-24 LAB — BASIC METABOLIC PANEL
Anion gap: 7 (ref 5–15)
BUN: 13 mg/dL (ref 6–20)
CO2: 25 mmol/L (ref 22–32)
Calcium: 8.6 mg/dL — ABNORMAL LOW (ref 8.9–10.3)
Chloride: 108 mmol/L (ref 101–111)
Creatinine, Ser: 0.71 mg/dL (ref 0.44–1.00)
GFR calc Af Amer: >60 mL/min (ref >60)
GFR calc non Af Amer: >60 mL/min (ref >60)
Glucose, Bld: 135 mg/dL — ABNORMAL HIGH (ref 65–99)
Potassium: 4.2 mmol/L (ref 3.5–5.1)
Sodium: 140 mmol/L (ref 135–145)

## 2016-10-24 LAB — CBC
HCT: 33.2 % — ABNORMAL LOW (ref 36.0–46.0)
Hemoglobin: 11.3 g/dL — ABNORMAL LOW (ref 12.0–15.0)
MCH: 31 pg (ref 26.0–34.0)
MCHC: 34 g/dL (ref 30.0–36.0)
MCV: 91.2 fL (ref 78.0–100.0)
Platelets: 221 10*3/uL (ref 150–400)
RBC: 3.64 MIL/uL — ABNORMAL LOW (ref 3.87–5.11)
RDW: 13.3 % (ref 11.5–15.5)
WBC: 10.1 10*3/uL (ref 4.0–10.5)

## 2016-10-24 NOTE — Evaluation (Signed)
Physical Therapy Evaluation Patient Details Name: [name] MRN: [id number] DOB: [birth year] Today's Date: 10/24/2016   History of Present Illness  s/p L DA THA  Clinical Impression  Pt s/p L THR presents with decreased L LE strength/ROM and post op pain limiting functional mobility.  Pt should progress to dc home with family assist and HHPT follow up.    Follow Up Recommendations Home health PT    Equipment Recommendations  Rolling walker with 5" wheels    Recommendations for Other Services OT consult     Precautions / Restrictions Precautions Precautions: Fall Restrictions Weight Bearing Restrictions: No Other Position/Activity Restrictions: WBAT      Mobility  Bed Mobility Overal bed mobility: Needs Assistance Bed Mobility: Sit to Supine     Supine to sit: Mod assist Sit to supine: Min assist   General bed mobility comments: cues for sequence and assist to manage R LE  Transfers Overall transfer level: Needs assistance Equipment used: Rolling walker (2 wheeled) Transfers: Sit to/from Stand Sit to Stand: Min assist         General transfer comment: assist to rise and stabilize  Ambulation/Gait Ambulation/Gait assistance: Min assist Ambulation Distance (Feet): 68 Feet Assistive device: Rolling walker (2 wheeled) Gait Pattern/deviations: Step-to pattern;Decreased step length - right;Decreased step length - left;Shuffle;Trunk flexed;Antalgic Gait velocity: decr Gait velocity interpretation: Below normal speed for age/gender General Gait Details: cues for sequence, posture and position from [employer]            Wheelchair Mobility    Modified Rankin (Stroke Patients Only)       Balance                                             Pertinent Vitals/Pain Pain Assessment: 0-10 Pain Score: 7  Pain Location: L hip with weight bearing Pain Descriptors / Indicators: Aching Pain Intervention(s): Limited activity  within patient's tolerance;Monitored during session;Premedicated before session;Ice applied    Home Living Family/patient expects to be discharged to:: Private residence Living Arrangements: Spouse/significant other Available Help at Discharge: Family Type of Home: House Home Access: Stairs to enter   Technical brewer of Steps: 1 Home Layout: Able to live on main level with bedroom/bathroom Home Equipment: Walker - 4 wheels      Prior Function Level of Independence: Independent               Hand Dominance        Extremity/Trunk Assessment   Upper Extremity Assessment Upper Extremity Assessment: Overall WFL for tasks assessed    Lower Extremity Assessment Lower Extremity Assessment: RLE deficits/detail RLE Deficits / Details: Strength at hip 2+/5 with AAROM at hip to 80 flex and 15 abd    Cervical / Trunk Assessment Cervical / Trunk Assessment: Normal  Communication   Communication: No difficulties  Cognition Arousal/Alertness: Awake/alert Behavior During Therapy: WFL for tasks assessed/performed Overall Cognitive Status: Within Functional Limits for tasks assessed                      General Comments      Exercises Total Joint Exercises Ankle Circles/Pumps: AROM;Both;15 reps;Supine Quad Sets: AROM;Both;10 reps;Supine Heel Slides: AAROM;15 reps;Supine;Right Hip ABduction/ADduction: AROM;Right;10 reps;Supine   Assessment/Plan    PT Assessment Patient needs continued PT services  PT Problem List Decreased strength;Decreased range of motion;Decreased activity tolerance;Decreased mobility;Decreased  knowledge of use of DME;Pain          PT Treatment Interventions DME instruction;Gait training;Stair training;Functional mobility training;Therapeutic activities;Therapeutic exercise;Patient/family education    PT Goals (Current goals can be found in the Care Plan section)  Acute Rehab PT Goals Patient Stated Goal: home PT Goal Formulation:  With patient Time For Goal Achievement: 10/28/16 Potential to Achieve Goals: Good    Frequency 7X/week   Barriers to discharge        Co-evaluation               End of Session Equipment Utilized During Treatment: Gait belt Activity Tolerance: Patient tolerated treatment well;Patient limited by pain Patient left: in bed;with call bell/phone within reach Nurse Communication: Mobility status         Time: [id number] PT Time Calculation (min) (ACUTE ONLY): 38 min   Charges:   PT Evaluation $PT Eval Low Complexity: 1 Procedure PT Treatments $Gait Training: 8-22 mins $Therapeutic Exercise: 8-22 mins   PT G Codes:        Daquon Greenleaf 2016-11-21, 12:35 PM

## 2016-10-24 NOTE — Progress Notes (Signed)
Physical Therapy Treatment Patient Details Name: [name] MRN: [id number] DOB: 1960-10-18 Today's Date: 11/21/16    History of Present Illness s/p L DA THA    PT Comments    Pt progressing steadily with mobility and hopeful for dc home tomorrow.  Follow Up Recommendations  Home health PT     Equipment Recommendations  Rolling walker with 5" wheels    Recommendations for Other Services OT consult     Precautions / Restrictions Precautions Precautions: Fall Restrictions Weight Bearing Restrictions: No Other Position/Activity Restrictions: WBAT    Mobility  Bed Mobility Overal bed mobility: Needs Assistance Bed Mobility: Supine to Sit     Supine to sit: Min assist     General bed mobility comments: cues for sequence and assist to manage R LE  Transfers Overall transfer level: Needs assistance Equipment used: Rolling walker (2 wheeled) Transfers: Sit to/from Stand Sit to Stand: Min guard         General transfer comment: cues for LE management and use of UEs to self assist  Ambulation/Gait Ambulation/Gait assistance: Min assist;Min guard Ambulation Distance (Feet): 159 Feet Assistive device: Rolling walker (2 wheeled) Gait Pattern/deviations: Step-to pattern;Decreased step length - right;Decreased step length - left;Shuffle;Trunk flexed;Antalgic Gait velocity: decr Gait velocity interpretation: Below normal speed for age/gender General Gait Details: cues for sequence, posture and position from [employer]   Stairs            Wheelchair Mobility    Modified Rankin (Stroke Patients Only)       Balance                                    Cognition Arousal/Alertness: Awake/alert Behavior During Therapy: WFL for tasks assessed/performed Overall Cognitive Status: Within Functional Limits for tasks assessed                      Exercises      General Comments        Pertinent Vitals/Pain Pain Assessment:  0-10 Pain Score: 6  Pain Location: L hip with weight bearing Pain Descriptors / Indicators: Aching Pain Intervention(s): Limited activity within patient's tolerance;Monitored during session;Premedicated before session;Ice applied    Home Living                      Prior Function            PT Goals (current goals can now be found in the care plan section) Acute Rehab PT Goals Patient Stated Goal: home PT Goal Formulation: With patient Time For Goal Achievement: 10/28/16 Potential to Achieve Goals: Good Progress towards PT goals: Progressing toward goals    Frequency    7X/week      PT Plan Current plan remains appropriate    Co-evaluation             End of Session Equipment Utilized During Treatment: Gait belt Activity Tolerance: Patient tolerated treatment well Patient left: in chair;with call bell/phone within reach;with family/visitor present     Time: [id number] PT Time Calculation (min) (ACUTE ONLY): 22 min  Charges:  $Gait Training: 8-22 mins                    G Codes:      [name] 11/21/2016, 5:07 PM

## 2016-10-24 NOTE — Progress Notes (Signed)
   Subjective: 1 Day Post-Op Procedure(s) (LRB): RIGHT TOTAL HIP ARTHROPLASTY ANTERIOR APPROACH (Right) Patient reports pain as moderate.  Had problems with nausea/vomiting yesterday afternoon/evening but feels better now We will start therapy today.  Plan is to go Home after hospital stay.  Objective: Vital signs in last 24 hours: Temp:  [97.6 F (36.4 C)-99.3 F (37.4 C)] 99.3 F (37.4 C) (01/18 0500) Pulse Rate:  [69-103] 72 (01/18 0500) Resp:  [11-16] 16 (01/18 0500) BP: (103-157)/(54-91) 112/54 (01/18 0500) SpO2:  [95 %-100 %] 100 % (01/18 0500) Weight:  [76.2 kg (168 lb)] 76.2 kg (168 lb) (01/17 0858)  Intake/Output from previous day:  Intake/Output Summary (Last 24 hours) at 10/24/16 0745 Last data filed at 10/24/16 0608  Gross per 24 hour  Intake             3270 ml  Output             1936 ml  Net             1334 ml    Intake/Output this shift: No intake/output data recorded.  Labs:  Recent Labs  10/24/16 0439  HGB 11.3*    Recent Labs  10/24/16 0439  WBC 10.1  RBC 3.64*  HCT 33.2*  PLT 221    Recent Labs  10/24/16 0439  NA 140  K 4.2  CL 108  CO2 25  BUN 13  CREATININE 0.71  GLUCOSE 135*  CALCIUM 8.6*   No results for input(s): LABPT, INR in the last 72 hours.  EXAM General - Patient is Alert, Appropriate and Oriented Extremity - Neurologically intact Neurovascular intact No cellulitis present Compartment soft Dressing - dressing C/D/I Motor Function - intact, moving foot and toes well on exam.  Hemovac pulled without difficulty.  Past Medical History:  Diagnosis Date  . 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 Complication of anesthesia   . Degenerative disc disease   . Degenerative disk disease   . OSA (obstructive sleep apnea)    cpap dont wear  . PONV (postoperative nausea and vomiting)     Assessment/Plan: 1 Day Post-Op Procedure(s) (LRB): RIGHT TOTAL HIP ARTHROPLASTY ANTERIOR APPROACH (Right) Principal  Problem:   OA (osteoarthritis) of hip   Advance diet Up with therapy D/C IV fluids Plan for discharge tomorrow  DVT Prophylaxis - Xarelto Weight Bearing As Tolerated right Leg Hemovac Pulled Begin Therapy  Gearlean Alf

## 2016-10-24 NOTE — Care Management Note (Signed)
Case Management Note  Patient Details  Name: [name] MRN: [id number] Date of Birth: September 09, 1961  Subjective/Objective:                  RIGHT TOTAL HIP ARTHROPLASTY ANTERIOR APPROACH (Right) Action/Plan: Discharge planniing Expected Discharge Date:                  Expected Discharge Plan:  [place]  In-House Referral:     Discharge planning Services  CM Consult  Post Acute Care Choice:  Durable Medical Equipment, Home Health Choice offered to:  Patient  DME Arranged:  3-N-1, Walker rolling DME Agency:  [place]:  PT [place]:  [facility] ([facility])  Status of Service:  Completed, signed off  If discussed at [employer], dates discussed:    Additional Comments: CM met with pt in room to offer choice of home health agency. Pt chooses [facility] to render HHPT. Referral given to [facility] rep, Tim. Cm notified [place] DME rep, Larene Beach to please deliver the rolling walker and 3n1 to room prior to discharge. No other CM needs were communicated. Dellie Catholic, RN 10/24/2016, 11:25 AM

## 2016-10-24 NOTE — Discharge Instructions (Addendum)
° °Dr. Frank Aluisio °Total Joint Specialist °[facility] °[address] °[place], [place] [address] °[phone] ° °ANTERIOR APPROACH TOTAL HIP REPLACEMENT POSTOPERATIVE DIRECTIONS ° ° °Hip Rehabilitation, Guidelines Following Surgery  °The results of a hip operation are greatly improved after range of motion and muscle strengthening exercises. Follow all safety measures which are given to protect your hip. If any of these exercises cause increased pain or swelling in your joint, decrease the amount until you are comfortable again. Then slowly increase the exercises. Call your caregiver if you have problems or questions.  ° °HOME CARE INSTRUCTIONS  °Remove items at home which could result in a fall. This includes throw rugs or furniture in walking pathways.  °· ICE to the affected hip every three hours for 30 minutes at a time and then as needed for pain and swelling.  Continue to use ice on the hip for pain and swelling from surgery. You may notice swelling that will progress down to the foot and ankle.  This is normal after surgery.  Elevate the leg when you are not up walking on it.   °· Continue to use the breathing machine which will help keep your temperature down.  It is common for your temperature to cycle up and down following surgery, especially at night when you are not up moving around and exerting yourself.  The breathing machine keeps your lungs expanded and your temperature down. ° ° °DIET °You may resume your previous home diet once your are discharged from the hospital. ° °DRESSING / WOUND CARE / SHOWERING °You may shower 3 days after surgery, but keep the wounds dry during showering.  You may use an occlusive plastic wrap (Press'n Seal for example), NO SOAKING/SUBMERGING IN THE BATHTUB.  If the bandage gets wet, change with a clean dry gauze.  If the incision gets wet, pat the wound dry with a clean towel. °You may start showering once you are discharged home but do not  submerge the incision under water. Just pat the incision dry and apply a dry gauze dressing on daily. °Change the surgical dressing daily and reapply a dry dressing each time. ° °ACTIVITY °Walk with your walker as instructed. °Use walker as long as suggested by your caregivers. °Avoid periods of inactivity such as sitting longer than an hour when not asleep. This helps prevent blood clots.  °You may resume a sexual relationship in one month or when given the OK by your doctor.  °You may return to work once you are cleared by your doctor.  °Do not drive a car for 6 weeks or until released by you surgeon.  °Do not drive while taking narcotics. ° °WEIGHT BEARING °Weight bearing as tolerated with assist device (walker, cane, etc) as directed, use it as long as suggested by your surgeon or therapist, typically at least 4-6 weeks. ° °POSTOPERATIVE CONSTIPATION PROTOCOL °Constipation - defined medically as fewer than three stools per week and severe constipation as less than one stool per week. ° °One of the most common issues patients have following surgery is constipation.  Even if you have a regular bowel pattern at home, your normal regimen is likely to be disrupted due to multiple reasons following surgery.  Combination of anesthesia, postoperative narcotics, change in appetite and fluid intake all can affect your bowels.  In order to avoid complications following surgery, here are some recommendations in order to help you during your recovery period. ° °Colace (docusate) - Pick up an over-the-counter   form of Colace or another stool softener and take twice a day as long as you are requiring postoperative pain medications.  Take with a full glass of water daily.  If you experience loose stools or diarrhea, hold the colace until you stool forms back up.  If your symptoms do not get better within 1 week or if they get worse, check with your doctor. ° °Dulcolax (bisacodyl) - Pick up over-the-counter and take as directed  by the product packaging as needed to assist with the movement of your bowels.  Take with a full glass of water.  Use this product as needed if not relieved by Colace only.  ° °MiraLax (polyethylene glycol) - Pick up over-the-counter to have on hand.  MiraLax is a solution that will increase the amount of water in your bowels to assist with bowel movements.  Take as directed and can mix with a glass of water, juice, soda, coffee, or tea.  Take if you go more than two days without a movement. °Do not use MiraLax more than once per day. Call your doctor if you are still constipated or irregular after using this medication for 7 days in a row. ° °If you continue to have problems with postoperative constipation, please contact the office for further assistance and recommendations.  If you experience "the worst abdominal pain ever" or develop nausea or vomiting, please contact the office immediatly for further recommendations for treatment. ° °ITCHING ° If you experience itching with your medications, try taking only a single pain pill, or even half a pain pill at a time.  You can also use Benadryl over the counter for itching or also to help with sleep.  ° °TED HOSE STOCKINGS °Wear the elastic stockings on both legs for three weeks following surgery during the day but you may remove then at night for sleeping. ° °MEDICATIONS °See your medication summary on the “After Visit Summary” that the nursing staff will review with you prior to discharge.  You may have some home medications which will be placed on hold until you complete the course of blood thinner medication.  It is important for you to complete the blood thinner medication as prescribed by your surgeon.  Continue your approved medications as instructed at time of discharge. ° °PRECAUTIONS °If you experience chest pain or shortness of breath - call 911 immediately for transfer to the hospital emergency department.  °If you develop a fever greater that 101 F,  purulent drainage from wound, increased redness or drainage from wound, foul odor from the wound/dressing, or calf pain - CONTACT YOUR SURGEON.   °                                                °FOLLOW-UP APPOINTMENTS °Make sure you keep all of your appointments after your operation with your surgeon and caregivers. You should call the office at the above phone number and make an appointment for approximately two weeks after the date of your surgery or on the date instructed by your surgeon outlined in the "After Visit Summary". ° °RANGE OF MOTION AND STRENGTHENING EXERCISES  °These exercises are designed to help you keep full movement of your hip joint. Follow your caregiver's or physical therapist's instructions. Perform all exercises about fifteen times, three times per day or as directed. Exercise both hips, even if you   have had only one joint replacement. These exercises can be done on a training (exercise) mat, on the floor, on a table or on a bed. Use whatever works the best and is most comfortable for you. Use music or television while you are exercising so that the exercises are a pleasant break in your day. This will make your life better with the exercises acting as a break in routine you can look forward to.  °Lying on your back, slowly slide your foot toward your buttocks, raising your knee up off the floor. Then slowly slide your foot back down until your leg is straight again.  °Lying on your back spread your legs as far apart as you can without causing discomfort.  °Lying on your side, raise your upper leg and foot straight up from the floor as far as is comfortable. Slowly lower the leg and repeat.  °Lying on your back, tighten up the muscle in the front of your thigh (quadriceps muscles). You can do this by keeping your leg straight and trying to raise your heel off the floor. This helps strengthen the largest muscle supporting your knee.  °Lying on your back, tighten up the muscles of your  buttocks both with the legs straight and with the knee bent at a comfortable angle while keeping your heel on the floor.  ° °IF YOU ARE TRANSFERRED TO A SKILLED REHAB FACILITY °If the patient is transferred to a skilled rehab facility following release from the hospital, a list of the current medications will be sent to the facility for the patient to continue.  When discharged from the skilled rehab facility, please have the facility set up the patient's Home Health Physical Therapy prior to being released. Also, the skilled facility will be responsible for providing the patient with their medications at time of release from the facility to include their pain medication, the muscle relaxants, and their blood thinner medication. If the patient is still at the rehab facility at time of the two week follow up appointment, the skilled rehab facility will also need to assist the patient in arranging follow up appointment in our office and any transportation needs. ° °MAKE SURE YOU:  °Understand these instructions.  °Get help right away if you are not doing well or get worse.  ° ° °Pick up stool softner and laxative for home use following surgery while on pain medications. °Do not submerge incision under water. °Please use good hand washing techniques while changing dressing each day. °May shower starting three days after surgery. °Please use a clean towel to pat the incision dry following showers. °Continue to use ice for pain and swelling after surgery. °Do not use any lotions or creams on the incision until instructed by your surgeon. ° °Take Xarelto for two and a half more weeks following discharge from the hospital, then discontinue Xarelto. °Once the patient has completed the blood thinner regimen, then take a Baby 81 mg Aspirin daily for three more weeks. ° °Information on my medicine - XARELTO® (Rivaroxaban) ° °This medication education was reviewed with me or my healthcare representative as part of my discharge  preparation.  The pharmacist that spoke with me during my hospital stay was:  Hussein Askar, PharmD Candidate ° °Why was Xarelto® prescribed for you? °Xarelto® was prescribed for you to reduce the risk of blood clots forming after orthopedic surgery. The medical term for these abnormal blood clots is venous thromboembolism (VTE). ° °What do you need to know about xarelto® ? °  Take your Xarelto® ONCE DAILY at the same time every day. °You may take it either with or without food. ° °If you have difficulty swallowing the tablet whole, you may crush it and mix in applesauce just prior to taking your dose. ° °Take Xarelto® exactly as prescribed by your doctor and DO NOT stop taking Xarelto® without talking to the doctor who prescribed the medication.  Stopping without other VTE prevention medication to take the place of Xarelto® may increase your risk of developing a clot. ° °After discharge, you should have regular check-up appointments with your healthcare provider that is prescribing your Xarelto®.   ° °What do you do if you miss a dose? °If you miss a dose, take it as soon as you remember on the same day then continue your regularly scheduled once daily regimen the next day. Do not take two doses of Xarelto® on the same day.  ° °Important Safety Information °A possible side effect of Xarelto® is bleeding. You should call your healthcare provider right away if you experience any of the following: °? Bleeding from an injury or your nose that does not stop. °? Unusual colored urine (red or dark brown) or unusual colored stools (red or black). °? Unusual bruising for unknown reasons. °? A serious fall or if you hit your head (even if there is no bleeding). ° °Some medicines may interact with Xarelto® and might increase your risk of bleeding while on Xarelto®. To help avoid this, consult your healthcare provider or pharmacist prior to using any new prescription or non-prescription medications, including herbals, vitamins,  non-steroidal anti-inflammatory drugs (NSAIDs) and supplements. ° °This website has more information on Xarelto®: www.xarelto.com. ° ° ° ° °

## 2016-10-24 NOTE — Evaluation (Signed)
Occupational Therapy Evaluation Patient Details Name: [name] MRN: [id number] DOB: [birth year] Today's Date: 10/24/2016    History of Present Illness s/p L DA THA   Clinical Impression   This 56 year old female was admitted for the above sx. She will benefit from continued OT in acute. Goals are for supervision to min guard level for bathroom transfers.    Follow Up Recommendations  No OT follow up    Equipment Recommendations  3 in 1 bedside commode (to be further assessed:  )    Recommendations for Other Services       Precautions / Restrictions Precautions Precautions: Fall Restrictions Weight Bearing Restrictions: No      Mobility Bed Mobility Overal bed mobility: Needs Assistance Bed Mobility: Supine to Sit     Supine to sit: Mod assist     General bed mobility comments: assist for legs and trunk.  Pt requested that her husband come assist  Transfers Overall transfer level: Needs assistance Equipment used: Rolling walker (2 wheeled) Transfers: Sit to/from Stand Sit to Stand: Min assist         General transfer comment: assist to rise and stabilize    Balance                                            ADL Overall ADL's : Needs assistance/impaired     Grooming: Set up;Sitting   Upper Body Bathing: Set up;Sitting   Lower Body Bathing: Moderate assistance;Sit to/from stand   Upper Body Dressing : Set up;Sitting   Lower Body Dressing: Maximal assistance;Sit to/from stand   Toilet Transfer: Minimal assistance;RW;BSC;Ambulation   Toileting- Clothing Manipulation and Hygiene: Minimal assistance;Sit to/from stand         General ADL Comments: stood at sink and brushed teeth and ambulated to bathroom and practiced commode transfer from high toilet:  may need 3:1.  Husband is helpful     Vision     Perception     Praxis      Pertinent Vitals/Pain Pain Assessment: 0-10 Pain Score: 7  Pain Location: L  hip with weight bearing Pain Descriptors / Indicators: Aching Pain Intervention(s): Limited activity within patient's tolerance;Monitored during session;Premedicated before session;Repositioned;Ice applied     Hand Dominance     Extremity/Trunk Assessment Upper Extremity Assessment Upper Extremity Assessment: Overall WFL for tasks assessed           Communication Communication Communication: No difficulties   Cognition Arousal/Alertness: Awake/alert Behavior During Therapy: WFL for tasks assessed/performed Overall Cognitive Status: Within Functional Limits for tasks assessed                     General Comments       Exercises       Shoulder Instructions      Home Living Family/patient expects to be discharged to:: Private residence Living Arrangements: Spouse/significant other Available Help at Discharge: Family               Bathroom Shower/Tub: Walk-in Psychologist, prison and probation services: Standard                Prior Functioning/Environment Level of Independence: Independent                 OT Problem List: Decreased strength;Decreased activity tolerance;Pain;Decreased knowledge of use of DME or AE   OT Treatment/Interventions:  Self-care/ADL training;DME and/or AE instruction;Patient/family education    OT Goals(Current goals can be found in the care plan section) Acute Rehab OT Goals Patient Stated Goal: home OT Goal Formulation: With patient Time For Goal Achievement: 10/31/16 Potential to Achieve Goals: Good ADL Goals Pt Will Transfer to Toilet: with supervision;ambulating;regular height toilet (vs 3:1) Pt Will Perform Tub/Shower Transfer: Shower transfer;ambulating;3 in 1;with min guard assist  OT Frequency: Min 2X/week   Barriers to D/C:            Co-evaluation              End of Session    Activity Tolerance: Patient tolerated treatment well Patient left: in chair;with call bell/phone within reach;with family/visitor  present   Time: [id number] OT Time Calculation (min): 27 min Charges:  OT General Charges $OT Visit: 1 Procedure OT Evaluation $OT Eval Low Complexity: 1 Procedure OT Treatments $Self Care/Home Management : 8-22 mins G-Codes:    [name] 2016/11/04, 11:04 AM Lesle Chris, OTR/L [phone] 11-04-2016

## 2016-10-25 LAB — BASIC METABOLIC PANEL
Anion gap: 5 (ref 5–15)
BUN: 16 mg/dL (ref 6–20)
CO2: 28 mmol/L (ref 22–32)
Calcium: 8.4 mg/dL — ABNORMAL LOW (ref 8.9–10.3)
Chloride: 108 mmol/L (ref 101–111)
Creatinine, Ser: 0.67 mg/dL (ref 0.44–1.00)
GFR calc Af Amer: >60 mL/min (ref >60)
GFR calc non Af Amer: >60 mL/min (ref >60)
Glucose, Bld: 105 mg/dL — ABNORMAL HIGH (ref 65–99)
Potassium: 3.8 mmol/L (ref 3.5–5.1)
Sodium: 141 mmol/L (ref 135–145)

## 2016-10-25 LAB — CBC
HCT: 33.1 % — ABNORMAL LOW (ref 36.0–46.0)
Hemoglobin: 10.9 g/dL — ABNORMAL LOW (ref 12.0–15.0)
MCH: 30.4 pg (ref 26.0–34.0)
MCHC: 32.9 g/dL (ref 30.0–36.0)
MCV: 92.5 fL (ref 78.0–100.0)
Platelets: 211 10*3/uL (ref 150–400)
RBC: 3.58 MIL/uL — ABNORMAL LOW (ref 3.87–5.11)
RDW: 13.7 % (ref 11.5–15.5)
WBC: 10.7 10*3/uL — ABNORMAL HIGH (ref 4.0–10.5)

## 2016-10-25 MED ORDER — RIVAROXABAN 10 MG PO TABS: 10.0000 mg | ORAL_TABLET | Freq: Every day | ORAL | 0 refills | Status: DC

## 2016-10-25 MED ORDER — HYDROMORPHONE HCL 2 MG PO TABS: 2.0000 mg | ORAL_TABLET | ORAL | 0 refills | Status: DC | PRN

## 2016-10-25 MED ORDER — METHOCARBAMOL 500 MG PO TABS: 500.0000 mg | ORAL_TABLET | Freq: Four times a day (QID) | ORAL | 0 refills | Status: DC | PRN

## 2016-10-25 MED ORDER — TRAMADOL HCL 50 MG PO TABS: 50.0000 mg | ORAL_TABLET | Freq: Four times a day (QID) | ORAL | 0 refills | Status: DC | PRN

## 2016-10-25 NOTE — Progress Notes (Signed)
   Subjective: 2 Days Post-Op Procedure(s) (LRB): RIGHT TOTAL HIP ARTHROPLASTY ANTERIOR APPROACH (Right) Patient reports pain as mild.   Plan is to go Home after hospital stay.  Objective: Vital signs in last 24 hours: Temp:  [98.9 F (37.2 C)-99.1 F (37.3 C)] 99.1 F (37.3 C) (01/19 0550) Pulse Rate:  [63-79] 79 (01/19 0550) Resp:  [17-18] 18 (01/19 0550) BP: (105-116)/(53-68) 110/68 (01/19 0550) SpO2:  [92 %-98 %] 98 % (01/19 0550)  Intake/Output from previous day:  Intake/Output Summary (Last 24 hours) at 10/25/16 0805 Last data filed at 10/25/16 0544  Gross per 24 hour  Intake             1800 ml  Output             1025 ml  Net              775 ml    Intake/Output this shift: No intake/output data recorded.  Labs:  Recent Labs  10/24/16 0439 10/25/16 0426  HGB 11.3* 10.9*    Recent Labs  10/24/16 0439 10/25/16 0426  WBC 10.1 10.7*  RBC 3.64* 3.58*  HCT 33.2* 33.1*  PLT 221 211    Recent Labs  10/24/16 0439 10/25/16 0426  NA 140 141  K 4.2 3.8  CL 108 108  CO2 25 28  BUN 13 16  CREATININE 0.71 0.67  GLUCOSE 135* 105*  CALCIUM 8.6* 8.4*   No results for input(s): LABPT, INR in the last 72 hours.  EXAM General - Patient is Alert, Appropriate and Oriented Extremity - Neurologically intact Neurovascular intact No cellulitis present Compartment soft Dressing/Incision - clean, dry, no drainage Motor Function - intact, moving foot and toes well on exam.   Past Medical History:  Diagnosis Date  . 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 Complication of anesthesia   . Degenerative disc disease   . Degenerative disk disease   . OSA (obstructive sleep apnea)    cpap dont wear  . PONV (postoperative nausea and vomiting)     Assessment/Plan: 2 Days Post-Op Procedure(s) (LRB): RIGHT TOTAL HIP ARTHROPLASTY ANTERIOR APPROACH (Right) Principal Problem:   OA (osteoarthritis) of hip   Discharge home with home health  DVT Prophylaxis  - Xarelto Weight Bearing As Tolerated right Leg  [name] V 10/25/2016, 8:05 AM

## 2016-10-25 NOTE — Discharge Summary (Signed)
Physician Discharge Summary   Patient ID: [name] MRN: [id number] DOB/AGE: December 04, 1960 56 y.o.  Admit date: 10/23/2016 Discharge date: 10-25-2016  Primary Diagnosis:  Osteoarthritis of the Right hip.   Admission Diagnoses:  Past Medical History:  Diagnosis Date  . 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 Complication of anesthesia   . Degenerative disc disease   . Degenerative disk disease   . OSA (obstructive sleep apnea)    cpap dont wear  . PONV (postoperative nausea and vomiting)    Discharge Diagnoses:   Principal Problem:   OA (osteoarthritis) of hip  Estimated body mass index is 30.73 kg/m as calculated from the following:   Height as of this encounter: 5' 2"  (1.575 m).   Weight as of this encounter: 76.2 kg (168 lb).  Procedure(s) (LRB): RIGHT TOTAL HIP ARTHROPLASTY ANTERIOR APPROACH (Right)   Consults: None  HPI: [name] is a 56 y.o. female who has advanced end-  stage arthritis of their Right  hip with progressively worsening pain and  dysfunction.The patient has failed nonoperative management and presents for  total hip arthroplasty.   Laboratory Data: Admission on 10/23/2016  Component Date Value Ref Range Status  . WBC 10/24/2016 10.1  4.0 - 10.5 K/uL Final  . RBC 10/24/2016 3.64* 3.87 - 5.11 MIL/uL Final  . Hemoglobin 10/24/2016 11.3* 12.0 - 15.0 g/dL Final  . HCT 10/24/2016 33.2* 36.0 - 46.0 % Final  . MCV 10/24/2016 91.2  78.0 - 100.0 fL Final  . MCH 10/24/2016 31.0  26.0 - 34.0 pg Final  . MCHC 10/24/2016 34.0  30.0 - 36.0 g/dL Final  . RDW 10/24/2016 13.3  11.5 - 15.5 % Final  . Platelets 10/24/2016 221  150 - 400 K/uL Final  . Sodium 10/24/2016 140  135 - 145 mmol/L Final  . Potassium 10/24/2016 4.2  3.5 - 5.1 mmol/L Final  . Chloride 10/24/2016 108  101 - 111 mmol/L Final  . CO2 10/24/2016 25  22 - 32 mmol/L Final  . Glucose, Bld 10/24/2016 135* 65 - 99 mg/dL Final  . BUN 10/24/2016 13  6 - 20 mg/dL Final  .  Creatinine, Ser 10/24/2016 0.71  0.44 - 1.00 mg/dL Final  . Calcium 10/24/2016 8.6* 8.9 - 10.3 mg/dL Final  . GFR calc non Af Amer 10/24/2016 >60  >60 mL/min Final  . GFR calc Af Amer 10/24/2016 >60  >60 mL/min Final   Comment: (NOTE) The eGFR has been calculated using the CKD EPI equation. This calculation has not been validated in all clinical situations. eGFR's persistently <60 mL/min signify possible Chronic Kidney Disease.   . Anion gap 10/24/2016 7  5 - 15 Final  . WBC 10/25/2016 10.7* 4.0 - 10.5 K/uL Final  . RBC 10/25/2016 3.58* 3.87 - 5.11 MIL/uL Final  . Hemoglobin 10/25/2016 10.9* 12.0 - 15.0 g/dL Final  . HCT 10/25/2016 33.1* 36.0 - 46.0 % Final  . MCV 10/25/2016 92.5  78.0 - 100.0 fL Final  . MCH 10/25/2016 30.4  26.0 - 34.0 pg Final  . MCHC 10/25/2016 32.9  30.0 - 36.0 g/dL Final  . RDW 10/25/2016 13.7  11.5 - 15.5 % Final  . Platelets 10/25/2016 211  150 - 400 K/uL Final  . Sodium 10/25/2016 141  135 - 145 mmol/L Final  . Potassium 10/25/2016 3.8  3.5 - 5.1 mmol/L Final  . Chloride 10/25/2016 108  101 - 111 mmol/L Final  . CO2 10/25/2016 28  22 - 32  mmol/L Final  . Glucose, Bld 10/25/2016 105* 65 - 99 mg/dL Final  . BUN 10/25/2016 16  6 - 20 mg/dL Final  . Creatinine, Ser 10/25/2016 0.67  0.44 - 1.00 mg/dL Final  . Calcium 10/25/2016 8.4* 8.9 - 10.3 mg/dL Final  . GFR calc non Af Amer 10/25/2016 >60  >60 mL/min Final  . GFR calc Af Amer 10/25/2016 >60  >60 mL/min Final   Comment: (NOTE) The eGFR has been calculated using the CKD EPI equation. This calculation has not been validated in all clinical situations. eGFR's persistently <60 mL/min signify possible Chronic Kidney Disease.   Georgiann Hahn gap 10/25/2016 5  5 - 15 Final  Hospital Outpatient Visit on 10/17/2016  Component Date Value Ref Range Status  . aPTT 10/17/2016 29  24 - 36 seconds Final  . WBC 10/17/2016 7.0  4.0 - 10.5 K/uL Final  . RBC 10/17/2016 4.72  3.87 - 5.11 MIL/uL Final  . Hemoglobin 10/17/2016  14.3  12.0 - 15.0 g/dL Final  . HCT 10/17/2016 42.8  36.0 - 46.0 % Final  . MCV 10/17/2016 90.7  78.0 - 100.0 fL Final  . MCH 10/17/2016 30.3  26.0 - 34.0 pg Final  . MCHC 10/17/2016 33.4  30.0 - 36.0 g/dL Final  . RDW 10/17/2016 13.2  11.5 - 15.5 % Final  . Platelets 10/17/2016 276  150 - 400 K/uL Final  . Sodium 10/17/2016 141  135 - 145 mmol/L Final  . Potassium 10/17/2016 4.3  3.5 - 5.1 mmol/L Final  . Chloride 10/17/2016 104  101 - 111 mmol/L Final  . CO2 10/17/2016 30  22 - 32 mmol/L Final  . Glucose, Bld 10/17/2016 84  65 - 99 mg/dL Final  . BUN 10/17/2016 21* 6 - 20 mg/dL Final  . Creatinine, Ser 10/17/2016 0.87  0.44 - 1.00 mg/dL Final  . Calcium 10/17/2016 9.5  8.9 - 10.3 mg/dL Final  . Total Protein 10/17/2016 7.7  6.5 - 8.1 g/dL Final  . Albumin 10/17/2016 4.4  3.5 - 5.0 g/dL Final  . AST 10/17/2016 20  15 - 41 U/L Final  . ALT 10/17/2016 17  14 - 54 U/L Final  . Alkaline Phosphatase 10/17/2016 63  38 - 126 U/L Final  . Total Bilirubin 10/17/2016 0.5  0.3 - 1.2 mg/dL Final  . GFR calc non Af Amer 10/17/2016 >60  >60 mL/min Final  . GFR calc Af Amer 10/17/2016 >60  >60 mL/min Final   Comment: (NOTE) The eGFR has been calculated using the CKD EPI equation. This calculation has not been validated in all clinical situations. eGFR's persistently <60 mL/min signify possible Chronic Kidney Disease.   . Anion gap 10/17/2016 7  5 - 15 Final  . Prothrombin Time 10/17/2016 12.5  11.4 - 15.2 seconds Final  . INR 10/17/2016 0.93   Final  . ABO/RH(D) 10/17/2016 O POS   Final  . Antibody Screen 10/17/2016 NEG   Final  . Sample Expiration 10/17/2016 10/26/2016   Final  . Extend sample reason 10/17/2016 NO TRANSFUSIONS OR PREGNANCY IN THE PAST 3 MONTHS   Final  . MRSA, PCR 10/17/2016 NEGATIVE  NEGATIVE Final  . Staphylococcus aureus 10/17/2016 NEGATIVE  NEGATIVE Final   Comment:        The Xpert SA Assay (FDA approved for NASAL specimens in patients over 69 years of age), is one  component of a comprehensive surveillance program.  Test performance has been validated by [facility] for patients greater than or  equal to 24 year old. It is not intended to diagnose infection nor to guide or monitor treatment.   . ABO/RH(D) 10/17/2016 O POS   Final     X-Rays:Dg Pelvis Portable  Result Date: 10/23/2016 CLINICAL DATA:  Right hip arthroplasty EXAM: PORTABLE PELVIS 1-2 VIEWS COMPARISON:  None. FINDINGS: Interval right total hip arthroplasty without failure complication. No fracture or dislocation. Postsurgical changes in the surrounding soft tissues. Surgical drain is present over the right intertrochanteric region. Mild osteoarthritis of the left hip. IMPRESSION: Interval right total hip arthroplasty. Electronically Signed   By: Kathreen Devoid   On: 10/23/2016 12:53   Dg C-arm 1-60 Min-no Report  Result Date: 10/23/2016 There is no Radiologist interpretation  for this exam.   EKG: Orders placed or performed during the hospital encounter of 05/07/12  . EKG     Hospital Course: Patient was admitted to [facility] and taken to the OR and underwent the above state procedure without complications.  Patient tolerated the procedure well and was later transferred to the recovery room and then to the orthopaedic floor for postoperative care.  They were given PO and IV analgesics for pain control following their surgery.  They were given 24 hours of postoperative antibiotics of  Anti-infectives    Start     Dose/Rate Route Frequency Ordered Stop   10/23/16 1700  ceFAZolin (ANCEF) IVPB 2g/100 mL premix     2 g 200 mL/hr over 30 Minutes Intravenous Every 6 hours 10/23/16 1355 10/23/16 2337   10/23/16 0825  ceFAZolin (ANCEF) IVPB 2g/100 mL premix     2 g 200 mL/hr over 30 Minutes Intravenous On call to O.R. 10/23/16 0825 10/23/16 1115     and started on DVT prophylaxis in the form of Xarelto.   PT and OT were ordered for total hip protocol.  The patient was allowed to  be WBAT with therapy. Discharge planning was consulted to help with postop disposition and equipment needs.  Patient had a tough night on the evening of surgery. Had problems with nausea/vomiting.  They started to get up OOB with therapy on day one.  Hemovac drain was pulled without difficulty.  Continued to work with therapy into day two.  Dressing was changed on day two and the incision was healing well. Patient was seen in rounds on POD 2 by Dr. Wynelle Link.  Her nausea and vomiting had resolved and she was ready to go home.  Diet: Regular diet Activity:WBAT Follow-up:in 2 weeks Disposition - Home Discharged Condition: good   Discharge Instructions    Call MD / Call 911    Complete by:  As directed    If you experience chest pain or shortness of breath, CALL 911 and be transported to the hospital emergency room.  If you develope a fever above 101 F, pus (white drainage) or increased drainage or redness at the wound, or calf pain, call your surgeon's office.   Change dressing    Complete by:  As directed    You may change your dressing dressing daily with sterile 4 x 4 inch gauze dressing and paper tape.  Do not submerge the incision under water.   Constipation Prevention    Complete by:  As directed    Drink plenty of fluids.  Prune juice may be helpful.  You may use a stool softener, such as Colace (over the counter) 100 mg twice a day.  Use MiraLax (over the counter) for constipation as needed.  Diet general    Complete by:  As directed    Discharge instructions    Complete by:  As directed    Pick up stool softner and laxative for home use following surgery while on pain medications. Do not submerge incision under water. Please use good hand washing techniques while changing dressing each day. May shower starting three days after surgery. Please use a clean towel to pat the incision dry following showers. Continue to use ice for pain and swelling after surgery. Do not use any lotions  or creams on the incision until instructed by your surgeon.  Wear both TED hose on both legs during the day every day for three weeks, but may have off at night at home.  Postoperative Constipation Protocol  Constipation - defined medically as fewer than three stools per week and severe constipation as less than one stool per week.  One of the most common issues patients have following surgery is constipation.  Even if you have a regular bowel pattern at home, your normal regimen is likely to be disrupted due to multiple reasons following surgery.  Combination of anesthesia, postoperative narcotics, change in appetite and fluid intake all can affect your bowels.  In order to avoid complications following surgery, here are some recommendations in order to help you during your recovery period.  Colace (docusate) - Pick up an over-the-counter form of Colace or another stool softener and take twice a day as long as you are requiring postoperative pain medications.  Take with a full glass of water daily.  If you experience loose stools or diarrhea, hold the colace until you stool forms back up.  If your symptoms do not get better within 1 week or if they get worse, check with your doctor.  Dulcolax (bisacodyl) - Pick up over-the-counter and take as directed by the product packaging as needed to assist with the movement of your bowels.  Take with a full glass of water.  Use this product as needed if not relieved by Colace only.   MiraLax (polyethylene glycol) - Pick up over-the-counter to have on hand.  MiraLax is a solution that will increase the amount of water in your bowels to assist with bowel movements.  Take as directed and can mix with a glass of water, juice, soda, coffee, or tea.  Take if you go more than two days without a movement. Do not use MiraLax more than once per day. Call your doctor if you are still constipated or irregular after using this medication for 7 days in a row.  If you  continue to have problems with postoperative constipation, please contact the office for further assistance and recommendations.  If you experience "the worst abdominal pain ever" or develop nausea or vomiting, please contact the office immediatly for further recommendations for treatment.   Take Xarelto for two and a half more weeks, then discontinue Xarelto. Once the patient has completed the blood thinner regimen, then take a Baby 81 mg Aspirin daily for three more weeks.   Do not sit on low chairs, stoools or toilet seats, as it may be difficult to get up from low surfaces    Complete by:  As directed    Driving restrictions    Complete by:  As directed    No driving until released by the physician.   Increase activity slowly as tolerated    Complete by:  As directed    Lifting restrictions    Complete by:  As  directed    No lifting until released by the physician.   Patient may shower    Complete by:  As directed    You may shower without a dressing once there is no drainage.  Do not wash over the wound.  If drainage remains, do not shower until drainage stops.   TED hose    Complete by:  As directed    Use stockings (TED hose) for 3 weeks on both leg(s).  You may remove them at night for sleeping.   Weight bearing as tolerated    Complete by:  As directed    Laterality:  right   Extremity:  Lower     Allergies as of 10/25/2016      Reactions   Epinephrine Shortness Of Breath   Codeine    Nausea      Medication List    STOP taking these medications   conjugated estrogens vaginal cream Commonly known as:  PREMARIN   Fish Oil 1000 MG Caps   ibuprofen 200 MG tablet Commonly known as:  ADVIL,MOTRIN   MOBIC 15 MG tablet Generic drug:  meloxicam   naproxen sodium 220 MG tablet Commonly known as:  ANAPROX   predniSONE 10 MG tablet Commonly known as:  DELTASONE   PROBIOTIC PO   Vitamin D-3 5000 units Tabs   zinc gluconate 50 MG tablet     TAKE these  medications   HYDROmorphone 2 MG tablet Commonly known as:  DILAUDID Take 1-2 tablets (2-4 mg total) by mouth every 4 (four) hours as needed for moderate pain or severe pain.   Magnesium 500 MG Caps Take 1,000 mg by mouth at bedtime.   methocarbamol 500 MG tablet Commonly known as:  ROBAXIN Take 1 tablet (500 mg total) by mouth every 6 (six) hours as needed for muscle spasms.   rivaroxaban 10 MG Tabs tablet Commonly known as:  XARELTO Take 1 tablet (10 mg total) by mouth daily with breakfast. Take Xarelto for two and a half more weeks following discharge from the hospital, then discontinue Xarelto. Once the patient has completed the blood thinner regimen, then take a Baby 81 mg Aspirin daily for three more weeks. Start taking on:  10/26/2016   traMADol 50 MG tablet Commonly known as:  ULTRAM Take 1-2 tablets (50-100 mg total) by mouth every 6 (six) hours as needed for moderate pain.            Durable Medical Equipment        Start     Ordered   10/25/16 [phone]  For home use only DME Walker rolling  Once    Comments:  Petite  Question:  Patient needs a walker to treat with the following condition  Answer:  OA (osteoarthritis) of hip   10/25/16 0842   10/24/16 1125  For home use only DME 3 n 1  Once     10/24/16 1124     Follow-up Information    [facility] Follow up.   Specialty:  Home Health Services Why:  home health physical therapy Contact information: [address] [place] [address] [phone]        [facility] - [facility] Follow up.   Why:  rolling walker and 3n1 Contact information: [place] [address] [phone]        Gearlean Alf, MD. Schedule an appointment as soon as possible for a visit on 11/05/2016.   Specialty:  Orthopedic  Surgery Contact information: [address] [place] [address] [phone]           Signed: Arlee Muslim, PA-C Orthopaedic  Surgery 10/25/2016, 9:00 AM

## 2016-10-25 NOTE — Progress Notes (Signed)
Occupational Therapy Treatment Patient Details Name: ADELLA PINKOS MRN: [id number] DOB: 05/27/1961 Today's Date: 10/25/2016    History of present illness s/p L DA THA   OT comments  All education was completed this session  Follow Up Recommendations  No OT follow up    Equipment Recommendations  None recommended by OT    Recommendations for Other Services      Precautions / Restrictions Precautions Precautions: Fall Restrictions Weight Bearing Restrictions: No Other Position/Activity Restrictions: WBAT       Mobility Bed Mobility               General bed mobility comments: oob  Transfers   Equipment used: Rolling walker (2 wheeled)   Sit to Stand: Supervision         General transfer comment: cues for UE placement    Balance                                   ADL                           Toilet Transfer: Min guard;Comfort height toilet;RW       Tub/ Shower Transfer: Walk-in shower;Min guard;Ambulation     General ADL Comments: simulated small shower ledge.  Pt has standard commode and doesn't feel she will need 3:1      Vision                     Perception     Praxis      Cognition   Behavior During Therapy: WFL for tasks assessed/performed Overall Cognitive Status: Within Functional Limits for tasks assessed                       Extremity/Trunk Assessment               Exercises     Shoulder Instructions       General Comments      Pertinent Vitals/ Pain       Pain Score: 2  Pain Location: L hip Pain Descriptors / Indicators: Aching Pain Intervention(s): Limited activity within patient's tolerance;Monitored during session;Premedicated before session;Repositioned;Ice applied  Home Living                                          Prior Functioning/Environment              Frequency           Progress Toward Goals  OT Goals(current  goals can now be found in the care plan section)  Progress towards OT goals: Progressing toward goals (no further OT is needed)     Plan      Co-evaluation                 End of Session     Activity Tolerance Patient tolerated treatment well   Patient Left in chair;with call bell/phone within reach;with family/visitor present   Nurse Communication          Time: [id number] OT Time Calculation (min): 13 min  Charges: OT General Charges $OT Visit: 1 Procedure OT Treatments $Self Care/Home Management : 8-22 mins  Jayd Cadieux 10/25/2016, 11:06 AM   Lesle Chris, OTR/L [phone] 10/25/2016

## 2016-10-25 NOTE — Progress Notes (Signed)
Physical Therapy Treatment Patient Details Name: [name] MRN: [id number] DOB: [birth year] Today's Date: 10/25/2016    History of Present Illness s/p L DA THA    PT Comments    Pt progressing well with mobility.  Reviewed car transfers, stairs and and therex  Follow Up Recommendations  Home health PT     Equipment Recommendations  Rolling walker with 5" wheels    Recommendations for Other Services OT consult     Precautions / Restrictions Precautions Precautions: Fall Restrictions Weight Bearing Restrictions: No Other Position/Activity Restrictions: WBAT    Mobility  Bed Mobility               General bed mobility comments: Pt OOB and declines return to bed  Transfers Overall transfer level: Needs assistance Equipment used: Rolling walker (2 wheeled) Transfers: Sit to/from Stand Sit to Stand: Supervision         General transfer comment: cues for UE placement  Ambulation/Gait Ambulation/Gait assistance: Min guard;Supervision Ambulation Distance (Feet): 200 Feet Assistive device: Rolling walker (2 wheeled) Gait Pattern/deviations: Step-to pattern;Step-through pattern;Decreased step length - right;Decreased step length - left;Shuffle;Trunk flexed Gait velocity: decr Gait velocity interpretation: Below normal speed for age/gender General Gait Details: cues for sequence, posture, ER on R and position from RW   Stairs Stairs: Yes   Stair Management: No rails;Step to pattern;Forwards;With walker Number of Stairs: 2 General stair comments: single step twice fwd with RW and cues for sequence and foot/RW placement  Wheelchair Mobility    Modified Rankin (Stroke Patients Only)       Balance                                    Cognition Arousal/Alertness: Awake/alert Behavior During Therapy: WFL for tasks assessed/performed Overall Cognitive Status: Within Functional Limits for tasks assessed                       Exercises Total Joint Exercises Ankle Circles/Pumps: AROM;Both;15 reps;Supine Quad Sets: AROM;Both;10 reps;Supine Heel Slides: AAROM;Supine;Right;20 reps Hip ABduction/ADduction: AROM;Right;Supine;15 reps Long Arc Quad: AAROM;AROM;Right;Seated;10 reps    General Comments        Pertinent Vitals/Pain Pain Assessment: 0-10 Pain Score: 4  Pain Location: L hip Pain Descriptors / Indicators: Aching Pain Intervention(s): Limited activity within patient's tolerance;Monitored during session;Premedicated before session;Ice applied    Home Living                      Prior Function            PT Goals (current goals can now be found in the care plan section) Acute Rehab PT Goals Patient Stated Goal: home PT Goal Formulation: With patient Time For Goal Achievement: 10/28/16 Potential to Achieve Goals: Good Progress towards PT goals: Progressing toward goals    Frequency    7X/week      PT Plan Current plan remains appropriate    Co-evaluation             End of Session Equipment Utilized During Treatment: Gait belt Activity Tolerance: Patient tolerated treatment well Patient left: in chair;with call bell/phone within reach;with family/visitor present     Time: [id number] PT Time Calculation (min) (ACUTE ONLY): 27 min  Charges:  $Gait Training: 8-22 mins $Therapeutic Exercise: 8-22 mins  G Codes:      [name] 07-Nov-2016, 12:42 PM

## 2017-04-04 DIAGNOSIS — R7301 Impaired fasting glucose: Secondary | ICD-10-CM | POA: Insufficient documentation

## 2017-04-04 DIAGNOSIS — Z8639 Personal history of other endocrine, nutritional and metabolic disease: Secondary | ICD-10-CM | POA: Insufficient documentation

## 2017-11-07 DIAGNOSIS — M25552 Pain in left hip: Secondary | ICD-10-CM | POA: Insufficient documentation

## 2017-11-30 ENCOUNTER — Ambulatory Visit: Payer: Self-pay | Admitting: Orthopedic Surgery

## 2018-01-15 ENCOUNTER — Other Ambulatory Visit: Payer: Self-pay | Admitting: Gastroenterology

## 2018-01-15 DIAGNOSIS — R1310 Dysphagia, unspecified: Secondary | ICD-10-CM

## 2018-01-16 ENCOUNTER — Encounter (HOSPITAL_COMMUNITY): Payer: Self-pay

## 2018-01-16 ENCOUNTER — Other Ambulatory Visit (HOSPITAL_COMMUNITY): Payer: Self-pay | Admitting: *Deleted

## 2018-01-16 NOTE — Patient Instructions (Signed)
[name]  01/16/2018   Your procedure is scheduled on: 01-28-18  Report to [facility] Main  Entrance  Report to admitting at 815 AM    Call this number if you have problems the morning of surgery [phone]   Remember: Do not eat food or drink liquids :After Midnight.     Take these medicines the morning of surgery with A SIP OF WATER: NONE                               You may not have any metal on your body including hair pins and              piercings  Do not wear jewelry, make-up, lotions, powders or perfumes, deodorant             Do not wear nail polish.  Do not shave  48 hours prior to surgery.              Men may shave face and neck.   Do not bring valuables to the hospital. [place].  Contacts, dentures or bridgework may not be worn into surgery.  Leave suitcase in the car. After surgery it may be brought to your room.                  Please read over the following fact sheets you were given: _____________________________________________________________________             [facility] - Preparing for Surgery Before surgery, you can play an important role.  Because skin is not sterile, your skin needs to be as free of germs as possible.  You can reduce the number of germs on your skin by washing with CHG (chlorahexidine gluconate) soap before surgery.  CHG is an antiseptic cleaner which kills germs and bonds with the skin to continue killing germs even after washing. Please DO NOT use if you have an allergy to CHG or antibacterial soaps.  If your skin becomes reddened/irritated stop using the CHG and inform your nurse when you arrive at Short Stay. Do not shave (including legs and underarms) for at least 48 hours prior to the first CHG shower.  You may shave your face/neck. Please follow these instructions carefully:  1.  Shower with CHG Soap the night before surgery and the   morning of Surgery.  2.  If you choose to wash your hair, wash your hair first as usual with your  normal  shampoo.  3.  After you shampoo, rinse your hair and body thoroughly to remove the  shampoo.                           4.  Use CHG as you would any other liquid soap.  You can apply chg directly  to the skin and wash                       Gently with a scrungie or clean washcloth.  5.  Apply the CHG Soap to your body ONLY FROM THE NECK DOWN.   Do not use on face/ open  Wound or open sores. Avoid contact with eyes, ears mouth and genitals (private parts).                       Wash face,  Genitals (private parts) with your normal soap.             6.  Wash thoroughly, paying special attention to the area where your surgery  will be performed.  7.  Thoroughly rinse your body with warm water from the neck down.  8.  DO NOT shower/wash with your normal soap after using and rinsing off  the CHG Soap.                9.  Pat yourself dry with a clean towel.            10.  Wear clean pajamas.            11.  Place clean sheets on your bed the night of your first shower and do not  sleep with pets. Day of Surgery : Do not apply any lotions/deodorants the morning of surgery.  Please wear clean clothes to the hospital/surgery center.  FAILURE TO FOLLOW THESE INSTRUCTIONS MAY RESULT IN THE CANCELLATION OF YOUR SURGERY PATIENT SIGNATURE_________________________________  NURSE SIGNATURE__________________________________  ________________________________________________________________________   Adam Phenix  An incentive spirometer is a tool that can help keep your lungs clear and active. This tool measures how well you are filling your lungs with each breath. Taking long deep breaths may help reverse or decrease the chance of developing breathing (pulmonary) problems (especially infection) following:  A long period of time when you are unable to move or be  active. BEFORE THE PROCEDURE   If the spirometer includes an indicator to show your best effort, your nurse or respiratory therapist will set it to a desired goal.  If possible, sit up straight or lean slightly forward. Try not to slouch.  Hold the incentive spirometer in an upright position. INSTRUCTIONS FOR USE  1. Sit on the edge of your bed if possible, or sit up as far as you can in bed or on a chair. 2. Hold the incentive spirometer in an upright position. 3. Breathe out normally. 4. Place the mouthpiece in your mouth and seal your lips tightly around it. 5. Breathe in slowly and as deeply as possible, raising the piston or the ball toward the top of the column. 6. Hold your breath for 3-5 seconds or for as long as possible. Allow the piston or ball to fall to the bottom of the column. 7. Remove the mouthpiece from your mouth and breathe out normally. 8. Rest for a few seconds and repeat Steps 1 through 7 at least 10 times every 1-2 hours when you are awake. Take your time and take a few normal breaths between deep breaths. 9. The spirometer may include an indicator to show your best effort. Use the indicator as a goal to work toward during each repetition. 10. After each set of 10 deep breaths, practice coughing to be sure your lungs are clear. If you have an incision (the cut made at the time of surgery), support your incision when coughing by placing a pillow or rolled up towels firmly against it. Once you are able to get out of bed, walk around indoors and cough well. You may stop using the incentive spirometer when instructed by your caregiver.  RISKS AND COMPLICATIONS  Take your time so you do not get  dizzy or light-headed.  If you are in pain, you may need to take or ask for pain medication before doing incentive spirometry. It is harder to take a deep breath if you are having pain. AFTER USE  Rest and breathe slowly and easily.  It can be helpful to keep track of a log of  your progress. Your caregiver can provide you with a simple table to help with this. If you are using the spirometer at home, follow these instructions: [place] IF:   You are having difficultly using the spirometer.  You have trouble using the spirometer as often as instructed.  Your pain medication is not giving enough relief while using the spirometer.  You develop fever of 100.5 F (38.1 C) or higher. SEEK IMMEDIATE MEDICAL CARE IF:   You cough up bloody sputum that had not been present before.  You develop fever of 102 F (38.9 C) or greater.  You develop worsening pain at or near the incision site. MAKE SURE YOU:   Understand these instructions.  Will watch your condition.  Will get help right away if you are not doing well or get worse. Document Released: 02/03/2007 Document Revised: 12/16/2011 Document Reviewed: 04/06/2007 ExitCare Patient Information 2014 Exit[employer], [place].   ________________________________________________________________________  WHAT IS A BLOOD TRANSFUSION? Blood Transfusion Information  A transfusion is the replacement of blood or some of its parts. Blood is made up of multiple cells which provide different functions.  Red blood cells carry oxygen and are used for blood loss replacement.  White blood cells fight against infection.  Platelets control bleeding.  Plasma helps clot blood.  Other blood products are available for specialized needs, such as hemophilia or other clotting disorders. BEFORE THE TRANSFUSION  Who gives blood for transfusions?   Healthy volunteers who are fully evaluated to make sure their blood is safe. This is blood bank blood. Transfusion therapy is the safest it has ever been in the practice of medicine. Before blood is taken from a donor, a complete history is taken to make sure that person has no history of diseases nor engages in risky social behavior (examples are intravenous drug use or sexual activity  with multiple partners). The donor's travel history is screened to minimize risk of transmitting infections, such as malaria. The donated blood is tested for signs of infectious diseases, such as HIV and hepatitis. The blood is then tested to be sure it is compatible with you in order to minimize the chance of a transfusion reaction. If you or a relative donates blood, this is often done in anticipation of surgery and is not appropriate for emergency situations. It takes many days to process the donated blood. RISKS AND COMPLICATIONS Although transfusion therapy is very safe and saves many lives, the main dangers of transfusion include:   Getting an infectious disease.  Developing a transfusion reaction. This is an allergic reaction to something in the blood you were given. Every precaution is taken to prevent this. The decision to have a blood transfusion has been considered carefully by your caregiver before blood is given. Blood is not given unless the benefits outweigh the risks. AFTER THE TRANSFUSION  Right after receiving a blood transfusion, you will usually feel much better and more energetic. This is especially true if your red blood cells have gotten low (anemic). The transfusion raises the level of the red blood cells which carry oxygen, and this usually causes an energy increase.  The nurse administering the transfusion will  monitor you carefully for complications. HOME CARE INSTRUCTIONS  No special instructions are needed after a transfusion. You may find your energy is better. Speak with your caregiver about any limitations on activity for underlying diseases you may have. SEEK MEDICAL CARE IF:   Your condition is not improving after your transfusion.  You develop redness or irritation at the intravenous (IV) site. SEEK IMMEDIATE MEDICAL CARE IF:  Any of the following symptoms occur over the next 12 hours:  Shaking chills.  You have a temperature by mouth above 102 F (38.9  C), not controlled by medicine.  Chest, back, or muscle pain.  People around you feel you are not acting correctly or are confused.  Shortness of breath or difficulty breathing.  Dizziness and fainting.  You get a rash or develop hives.  You have a decrease in urine output.  Your urine turns a dark color or changes to pink, red, or brown. Any of the following symptoms occur over the next 10 days:  You have a temperature by mouth above 102 F (38.9 C), not controlled by medicine.  Shortness of breath.  Weakness after normal activity.  The white part of the eye turns yellow (jaundice).  You have a decrease in the amount of urine or are urinating less often.  Your urine turns a dark color or changes to pink, red, or brown. Document Released: 09/20/2000 Document Revised: 12/16/2011 Document Reviewed: 05/09/2008 [employer] Patient Information 2014 [place], [place].  _______________________________________________________________________

## 2018-01-19 ENCOUNTER — Ambulatory Visit
Admission: RE | Admit: 2018-01-19 | Discharge: 2018-01-19 | Disposition: A | Discharge status: Home / Self Care | Payer: Commercial Managed Care - PPO | Source: Ambulatory Visit | Attending: Gastroenterology | Admitting: Gastroenterology

## 2018-01-19 ENCOUNTER — Other Ambulatory Visit (HOSPITAL_COMMUNITY): Payer: Self-pay | Admitting: *Deleted

## 2018-01-19 DIAGNOSIS — R1310 Dysphagia, unspecified: Secondary | ICD-10-CM

## 2018-01-21 ENCOUNTER — Other Ambulatory Visit: Payer: Self-pay

## 2018-01-21 ENCOUNTER — Encounter (HOSPITAL_COMMUNITY)
Admission: RE | Admit: 2018-01-21 | Discharge: 2018-01-21 | Disposition: A | Discharge status: Home / Self Care | Payer: Commercial Managed Care - PPO | Source: Ambulatory Visit | Attending: Orthopedic Surgery | Admitting: Orthopedic Surgery

## 2018-01-21 ENCOUNTER — Encounter (HOSPITAL_COMMUNITY): Payer: Self-pay

## 2018-01-21 DIAGNOSIS — Z01812 Encounter for preprocedural laboratory examination: Secondary | ICD-10-CM | POA: Diagnosis not present

## 2018-01-21 HISTORY — DX: Unspecified osteoarthritis, unspecified site: M19.90

## 2018-01-21 HISTORY — DX: Gastro-esophageal reflux disease without esophagitis: K21.9

## 2018-01-21 LAB — PROTIME-INR
INR: 0.9
Prothrombin Time: 12.1 seconds (ref 11.4–15.2)

## 2018-01-21 LAB — HEPATIC FUNCTION PANEL
ALT: 19 U/L (ref 14–54)
AST: 19 U/L (ref 15–41)
Albumin: 4 g/dL (ref 3.5–5.0)
Alkaline Phosphatase: 64 U/L (ref 38–126)
Bilirubin, Direct: 0.1 mg/dL (ref 0.1–0.5)
Indirect Bilirubin: 0.3 mg/dL (ref 0.3–0.9)
Total Bilirubin: 0.4 mg/dL (ref 0.3–1.2)
Total Protein: 7.4 g/dL (ref 6.5–8.1)

## 2018-01-21 LAB — APTT: aPTT: 29 seconds (ref 24–36)

## 2018-01-21 LAB — SURGICAL PCR SCREEN
MRSA, PCR: NEGATIVE
Staphylococcus aureus: NEGATIVE

## 2018-01-21 NOTE — Progress Notes (Signed)
bmet and cbc done 01-13-18 eagle dr Dorthy Cooler on chart

## 2018-01-23 ENCOUNTER — Ambulatory Visit: Payer: Self-pay | Admitting: Orthopedic Surgery

## 2018-01-23 NOTE — H&P (View-Only) (Signed)
[name], [name] (57yo, F DOB 08-24-1961   Chief Complaint Left Hip Pain H&P left THA on 01/28/2018 at [facility]  Patient's Pharmacies [id number] [place], [place] [address], Ph [phone], Fax [phone]  Vitals Ht: 5 ft 2 in Wt: 165 lbs  BMI: 30.2  BP: 146/86 sitting L arm  Pulse: 68 bpm regular  Allergies Reviewed Allergies CODEINE: Nausea  EPINEPHRINE: Respiratory distress  XARELTO: Rash   Medications Reviewed Medications avocado oil (bulk) 01/17/18   entered Magally Vahle, PA-C B Complex 01/17/18   entered Freddye Cardamone, PA-C Fish Oil 01/17/18   entered Orton Capell, PA-C Intrinsi B12-Folate 01/17/18   entered Manning Luna, PA-C magnesium 01/17/18   entered Kiing Deakin, PA-C MCT Oil 01/17/18   entered Shakesha Soltau, PA-C meloxicam 15 mg tablet 09/23/17   filled [employer] Premarin 0.625 mg/gram vaginal cream 04/24/17   filled [employer]  Problems Reviewed Problems Pain of left hip joint - Onset: 11/07/2017 Osteoarthritis of left hip joint - Onset: 11/09/2017  Family History Reviewed Family History Father - Heart disease Mother - Heart disease  Social History Reviewed Social History Smoking Status: Never smoker Alcohol intake: Occasional Hand Dominance: Right Work related injury?: N 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 They are scheduled for left total hip replacement on 01/28/2018 with Dr.  Wynelle Link at [place] is a year out from her right total hip replacement. She is very pleased with her right hip. She has no pain in the right hip. However, she is having a lot of trouble with the left hip. She is having groin pain and lateral hip pain that bothers her both with activity and at night. She had a IA cortisone injection in her left hip in June 2018. It did help for a few months. She is wanting to move forward with having the left hip replaced. The LEFT hip has gotten progressively worse over the past few months. It is hurting almost as bad as the RIGHT one did prior to when she had that replaced. She is having significant functional limitations because of the LEFT hip. She really wants to have a better quality-of-life so would like to have this LEFT hip fixed. AP pelvis AP and lateral of the hips show that the prosthesis on the RIGHT is in excellent position with no periprosthetic abnormalities. On the LEFT she does have bone-on-bone arthritis in the hip joint with some subchondral cystic formation and marginal osteophytes. She has significant arthritis at LEFT hip with progressively worsening pain and dysfunction. At this point the most predictable means of improving her pain and function will be total hip arthroplasty. She is very familiar with her from having had the other side done last year. She has end-stage her she is ready to go ahead and proceed.   ROS Constitutional: Constitutional: no fever, chills, night sweats, or significant weight loss.  Cardiovascular: Cardiovascular: no palpitations or chest pain.  Respiratory: Respiratory: no cough or shortness of breath and No COPD.  Gastrointestinal: Gastrointestinal: no vomiting or nausea; hemorrhoids.  Musculoskeletal: Musculoskeletal: no swelling in Joints and  Joint Pain.  Neurologic: Neurologic: no numbness, tingling, or difficulty with balance.   Physical Exam Patient is a 57 year old  female.  General Mental Status - Alert, cooperative and good historian. General Appearance - pleasant, Not in acute distress. Orientation - Oriented X3. Build & Nutrition - Well nourished and Well developed.  Head and Neck Head - normocephalic, atraumatic . Neck Global Assessment - supple, no bruit auscultated on the right, no bruit auscultated on the left.  Eye Pupil - Bilateral - PERR Motion - Bilateral - EOMI.  Chest and Lung Exam Auscultation Breath sounds - clear at anterior chest wall and clear at posterior chest wall. Adventitious sounds - No Adventitious sounds.  Cardiovascular Auscultation Rhythm - Regular rate and rhythm. Heart Sounds - S1 WNL and S2 WNL. Murmurs & Other Heart Sounds - Auscultation of the heart reveals - No Murmurs.  Abdomen Palpation/Percussion Tenderness - Abdomen is non-tender to palpation. Abdomen is soft. Auscultation Auscultation of the abdomen reveals - Bowel sounds normal.  Genitourinary Note: Not done, not pertinent to present illness  Musculoskeletal No tenderness to palpation about the right greater trochanteric bursa. Moderate tenderness to palpation about the left greater trochanteric bursa. No masses or tumors palpated about the hips. Pain with passive and active motion of the left hip. No pain with motion of the right hip. ROM right hip 140 degrees flexion, 30 degrees internal rotation, 40 degrees external rotation, and 40 degrees abduction. ROM left hip 120 degrees flexion, 10 degrees internal rotation, 20 degrees external rotation, and 20 degrees abduction. Motion of the left hip does not cause pain into the left knee. Incision over the right hip is healed with no signs of infection. No effusion or tenderness to palpation about the knees. Sensation and motor function intact in LE. Distal pulses 2+ in LE. Calves soft and nontender. Radiographs-AP pelvis AP and lateral of the hips show that the prosthesis on the RIGHT is in excellent  position with no periprosthetic abnormalities. On the LEFT she does have bone-on-bone arthritis in the hip joint with some subchondral cystic formation and marginal osteophytes.  Assessment / Plan 1. Osteoarthritis of left hip joint M16.12: Unilateral primary osteoarthritis, left hip  Goals Patient Instructions Surgical Plans: Left Total Hip Replacement - Anterior Approach Disposition: Home, HHPT PCP: Dr. Dorthy Cooler IV TXA Anesthesia Issues: Nausea and Vomiting  Patient was instructed on what medications to stop prior to surgery. - Follow up visit in 2 weeks with Dr. Wynelle Link - Begin physical therapy following surgery - Pre-operative lab work as pre Pre-Surgical Testing - Prescriptions will be provided in hospital at time of discharge  Return to [place], MD for [facility] on 02/10/2018 at 01:45 PM  Encounter signed-off by Mickel Crow, PA-C

## 2018-01-23 NOTE — H&P (Signed)
[name], [name] (57yo, F DOB 06-20-1961   Chief Complaint Left Hip Pain H&P left THA on 01/28/2018 at [facility]  Patient's Pharmacies [id number]): [place], [place] [address], Ph [phone], Fax [phone]  Vitals Ht: 5 ft 2 in Wt: 165 lbs  BMI: 30.2  BP: 146/86 sitting L arm  Pulse: 68 bpm regular  Allergies Reviewed Allergies CODEINE: Nausea  EPINEPHRINE: Respiratory distress  XARELTO: Rash   Medications Reviewed Medications avocado oil (bulk) 01/17/18   entered Varnell Donate, PA-C B Complex 01/17/18   entered Hortence Charter, PA-C Fish Oil 01/17/18   entered Imojean Yoshino, PA-C Intrinsi B12-Folate 01/17/18   entered Edword Cu, PA-C magnesium 01/17/18   entered Jakwon Gayton, PA-C MCT Oil 01/17/18   entered Derius Ghosh, PA-C meloxicam 15 mg tablet 09/23/17   filled [employer] Premarin 0.625 mg/gram vaginal cream 04/24/17   filled [employer]  Problems Reviewed Problems Pain of left hip joint - Onset: 11/07/2017 Osteoarthritis of left hip joint - Onset: 11/09/2017  Family History Reviewed Family History Father - Heart disease Mother - Heart disease  Social History Reviewed Social History Smoking Status: Never smoker Alcohol intake: Occasional Hand Dominance: Right Work related injury?: N 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 They are scheduled for left total hip replacement on 01/28/2018 with Dr.  Wynelle Link at [place] is a year out from her right total hip replacement. She is very pleased with her right hip. She has no pain in the right hip. However, she is having a lot of trouble with the left hip. She is having groin pain and lateral hip pain that bothers her both with activity and at night. She had a IA cortisone injection in her left hip in June 2018. It did help for a few months. She is wanting to move forward with having the left hip replaced. The LEFT hip has gotten progressively worse over the past few months. It is hurting almost as bad as the RIGHT one did prior to when she had that replaced. She is having significant functional limitations because of the LEFT hip. She really wants to have a better quality-of-life so would like to have this LEFT hip fixed. AP pelvis AP and lateral of the hips show that the prosthesis on the RIGHT is in excellent position with no periprosthetic abnormalities. On the LEFT she does have bone-on-bone arthritis in the hip joint with some subchondral cystic formation and marginal osteophytes. She has significant arthritis at LEFT hip with progressively worsening pain and dysfunction. At this point the most predictable means of improving her pain and function will be total hip arthroplasty. She is very familiar with her from having had the other side done last year. She has end-stage her she is ready to go ahead and proceed.   ROS Constitutional: Constitutional: no fever, chills, night sweats, or significant weight loss.  Cardiovascular: Cardiovascular: no palpitations or chest pain.  Respiratory: Respiratory: no cough or shortness of breath and No COPD.  Gastrointestinal: Gastrointestinal: no vomiting or nausea; hemorrhoids.  Musculoskeletal: Musculoskeletal: no swelling in Joints and  Joint Pain.  Neurologic: Neurologic: no numbness, tingling, or difficulty with balance.   Physical Exam Patient is a 57 year old  female.  General Mental Status - Alert, cooperative and good historian. General Appearance - pleasant, Not in acute distress. Orientation - Oriented X3. Build & Nutrition - Well nourished and Well developed.  Head and Neck Head - normocephalic, atraumatic . Neck Global Assessment - supple, no bruit auscultated on the right, no bruit auscultated on the left.  Eye Pupil - Bilateral - PERR Motion - Bilateral - EOMI.  Chest and Lung Exam Auscultation Breath sounds - clear at anterior chest wall and clear at posterior chest wall. Adventitious sounds - No Adventitious sounds.  Cardiovascular Auscultation Rhythm - Regular rate and rhythm. Heart Sounds - S1 WNL and S2 WNL. Murmurs & Other Heart Sounds - Auscultation of the heart reveals - No Murmurs.  Abdomen Palpation/Percussion Tenderness - Abdomen is non-tender to palpation. Abdomen is soft. Auscultation Auscultation of the abdomen reveals - Bowel sounds normal.  Genitourinary Note: Not done, not pertinent to present illness  Musculoskeletal No tenderness to palpation about the right greater trochanteric bursa. Moderate tenderness to palpation about the left greater trochanteric bursa. No masses or tumors palpated about the hips. Pain with passive and active motion of the left hip. No pain with motion of the right hip. ROM right hip 140 degrees flexion, 30 degrees internal rotation, 40 degrees external rotation, and 40 degrees abduction. ROM left hip 120 degrees flexion, 10 degrees internal rotation, 20 degrees external rotation, and 20 degrees abduction. Motion of the left hip does not cause pain into the left knee. Incision over the right hip is healed with no signs of infection. No effusion or tenderness to palpation about the knees. Sensation and motor function intact in LE. Distal pulses 2+ in LE. Calves soft and nontender. Radiographs-AP pelvis AP and lateral of the hips show that the prosthesis on the RIGHT is in excellent  position with no periprosthetic abnormalities. On the LEFT she does have bone-on-bone arthritis in the hip joint with some subchondral cystic formation and marginal osteophytes.  Assessment / Plan 1. Osteoarthritis of left hip joint M16.12: Unilateral primary osteoarthritis, left hip  Goals Patient Instructions Surgical Plans: Left Total Hip Replacement - Anterior Approach Disposition: Home, HHPT PCP: Dr. Dorthy Cooler IV TXA Anesthesia Issues: Nausea and Vomiting  Patient was instructed on what medications to stop prior to surgery. - Follow up visit in 2 weeks with Dr. Wynelle Link - Begin physical therapy following surgery - Pre-operative lab work as pre Pre-Surgical Testing - Prescriptions will be provided in hospital at time of discharge  Return to [place], MD for [facility] on 02/10/2018 at 01:45 PM  Encounter signed-off by Mickel Crow, PA-C

## 2018-01-27 MED ORDER — TRANEXAMIC ACID 1000 MG/10ML IV SOLN
1000.0000 mg | INTRAVENOUS | Status: AC
  Administered 2018-01-28: 1000 mg via INTRAVENOUS
  Filled 2018-01-27: qty 1100

## 2018-01-28 ENCOUNTER — Encounter (HOSPITAL_COMMUNITY): Payer: Self-pay | Admitting: Certified Registered"

## 2018-01-28 ENCOUNTER — Inpatient Hospital Stay (HOSPITAL_COMMUNITY): Payer: Commercial Managed Care - PPO | Admitting: Certified Registered"

## 2018-01-28 ENCOUNTER — Encounter (HOSPITAL_COMMUNITY)
Admission: RE | Disposition: A | Discharge status: Home Health | Payer: Self-pay | Source: Ambulatory Visit | Attending: Orthopedic Surgery

## 2018-01-28 ENCOUNTER — Other Ambulatory Visit: Payer: Self-pay

## 2018-01-28 ENCOUNTER — Inpatient Hospital Stay (HOSPITAL_COMMUNITY)
Admission: RE | Admit: 2018-01-28 | Discharge: 2018-01-29 | DRG: 470 | Disposition: A | Discharge status: Home Health | Payer: Commercial Managed Care - PPO | Source: Ambulatory Visit | Attending: Orthopedic Surgery | Admitting: Orthopedic Surgery

## 2018-01-28 ENCOUNTER — Inpatient Hospital Stay (HOSPITAL_COMMUNITY): Payer: Commercial Managed Care - PPO

## 2018-01-28 DIAGNOSIS — R112 Nausea with vomiting, unspecified: Secondary | ICD-10-CM | POA: Diagnosis not present

## 2018-01-28 DIAGNOSIS — Z885 Allergy status to narcotic agent status: Secondary | ICD-10-CM

## 2018-01-28 DIAGNOSIS — E669 Obesity, unspecified: Secondary | ICD-10-CM | POA: Diagnosis present

## 2018-01-28 DIAGNOSIS — Z8249 Family history of ischemic heart disease and other diseases of the circulatory system: Secondary | ICD-10-CM | POA: Diagnosis not present

## 2018-01-28 DIAGNOSIS — Z96641 Presence of right artificial hip joint: Secondary | ICD-10-CM | POA: Diagnosis present

## 2018-01-28 DIAGNOSIS — K219 Gastro-esophageal reflux disease without esophagitis: Secondary | ICD-10-CM | POA: Diagnosis present

## 2018-01-28 DIAGNOSIS — Z888 Allergy status to other drugs, medicaments and biological substances status: Secondary | ICD-10-CM | POA: Diagnosis not present

## 2018-01-28 DIAGNOSIS — G4733 Obstructive sleep apnea (adult) (pediatric): Secondary | ICD-10-CM | POA: Diagnosis present

## 2018-01-28 DIAGNOSIS — M1612 Unilateral primary osteoarthritis, left hip: Principal | ICD-10-CM | POA: Diagnosis present

## 2018-01-28 DIAGNOSIS — Z96649 Presence of unspecified artificial hip joint: Secondary | ICD-10-CM

## 2018-01-28 DIAGNOSIS — Z6830 Body mass index (BMI) 30.0-30.9, adult: Secondary | ICD-10-CM | POA: Diagnosis not present

## 2018-01-28 DIAGNOSIS — M25752 Osteophyte, left hip: Secondary | ICD-10-CM | POA: Diagnosis present

## 2018-01-28 DIAGNOSIS — M169 Osteoarthritis of hip, unspecified: Secondary | ICD-10-CM | POA: Diagnosis present

## 2018-01-28 HISTORY — PX: TOTAL HIP ARTHROPLASTY: SHX124

## 2018-01-28 LAB — TYPE AND SCREEN
ABO/RH(D): O POS
Antibody Screen: NEGATIVE

## 2018-01-28 SURGERY — ARTHROPLASTY, HIP, TOTAL, ANTERIOR APPROACH
Anesthesia: General | Site: Hip | Laterality: Left

## 2018-01-28 MED ORDER — STERILE WATER FOR IRRIGATION IR SOLN
Status: DC | PRN
  Administered 2018-01-28: 2000 mL

## 2018-01-28 MED ORDER — HYDROMORPHONE HCL 1 MG/ML IJ SOLN
0.2500 mg | INTRAMUSCULAR | Status: DC | PRN
  Administered 2018-01-28 (×2): 0.5 mg via INTRAVENOUS

## 2018-01-28 MED ORDER — ASPIRIN EC 325 MG PO TBEC
325.0000 mg | DELAYED_RELEASE_TABLET | Freq: Every day | ORAL | Status: DC
  Administered 2018-01-29: 325 mg via ORAL
  Filled 2018-01-28: qty 1

## 2018-01-28 MED ORDER — EPHEDRINE SULFATE-NACL 50-0.9 MG/10ML-% IV SOSY
PREFILLED_SYRINGE | INTRAVENOUS | Status: DC | PRN
  Administered 2018-01-28: 10 mg via INTRAVENOUS
  Administered 2018-01-28: 5 mg via INTRAVENOUS

## 2018-01-28 MED ORDER — METHOCARBAMOL 500 MG PO TABS: 500.0000 mg | ORAL_TABLET | Freq: Four times a day (QID) | ORAL | 0 refills | Status: DC | PRN

## 2018-01-28 MED ORDER — SUCCINYLCHOLINE CHLORIDE 200 MG/10ML IV SOSY
PREFILLED_SYRINGE | INTRAVENOUS | Status: DC | PRN
  Administered 2018-01-28: 100 mg via INTRAVENOUS

## 2018-01-28 MED ORDER — HYDROMORPHONE HCL 1 MG/ML IJ SOLN
INTRAMUSCULAR | Status: AC
  Filled 2018-01-28: qty 1

## 2018-01-28 MED ORDER — SCOPOLAMINE 1 MG/3DAYS TD PT72
1.0000 | MEDICATED_PATCH | TRANSDERMAL | Status: DC
  Administered 2018-01-28: 1 via TRANSDERMAL

## 2018-01-28 MED ORDER — MORPHINE SULFATE (PF) 2 MG/ML IV SOLN
0.5000 mg | INTRAVENOUS | Status: DC | PRN
  Administered 2018-01-28: 1 mg via INTRAVENOUS
  Filled 2018-01-28: qty 1

## 2018-01-28 MED ORDER — MIDAZOLAM HCL 2 MG/2ML IJ SOLN
INTRAMUSCULAR | Status: AC
  Filled 2018-01-28: qty 2

## 2018-01-28 MED ORDER — TRAMADOL HCL 50 MG PO TABS: 50.0000 mg | ORAL_TABLET | Freq: Four times a day (QID) | ORAL | Status: DC | PRN

## 2018-01-28 MED ORDER — MEPERIDINE HCL 50 MG/ML IJ SOLN: 6.2500 mg | INTRAMUSCULAR | Status: DC | PRN

## 2018-01-28 MED ORDER — SUGAMMADEX SODIUM 200 MG/2ML IV SOLN
INTRAVENOUS | Status: DC | PRN
  Administered 2018-01-28: 150 mg via INTRAVENOUS

## 2018-01-28 MED ORDER — TRANEXAMIC ACID 1000 MG/10ML IV SOLN
1000.0000 mg | Freq: Once | INTRAVENOUS | Status: AC
  Administered 2018-01-28: 1000 mg via INTRAVENOUS
  Filled 2018-01-28: qty 1100

## 2018-01-28 MED ORDER — METOCLOPRAMIDE HCL 5 MG/ML IJ SOLN
5.0000 mg | Freq: Three times a day (TID) | INTRAMUSCULAR | Status: DC | PRN
  Administered 2018-01-28 – 2018-01-29 (×2): 10 mg via INTRAVENOUS
  Filled 2018-01-28 (×2): qty 2

## 2018-01-28 MED ORDER — PROPOFOL 10 MG/ML IV BOLUS
INTRAVENOUS | Status: AC
Start: 2018-01-28 — End: 2018-01-28
  Filled 2018-01-28: qty 20

## 2018-01-28 MED ORDER — METOCLOPRAMIDE HCL 5 MG PO TABS: 5.0000 mg | ORAL_TABLET | Freq: Three times a day (TID) | ORAL | Status: DC | PRN

## 2018-01-28 MED ORDER — BUPIVACAINE HCL (PF) 0.25 % IJ SOLN
INTRAMUSCULAR | Status: DC | PRN
  Administered 2018-01-28: 30 mL

## 2018-01-28 MED ORDER — FENTANYL CITRATE (PF) 250 MCG/5ML IJ SOLN
INTRAMUSCULAR | Status: DC | PRN
  Administered 2018-01-28: 100 ug via INTRAVENOUS
  Administered 2018-01-28 (×4): 50 ug via INTRAVENOUS
  Administered 2018-01-28: 100 ug via INTRAVENOUS

## 2018-01-28 MED ORDER — HYDROCODONE-ACETAMINOPHEN 7.5-325 MG PO TABS
1.0000 | ORAL_TABLET | ORAL | Status: DC | PRN
  Filled 2018-01-28: qty 1

## 2018-01-28 MED ORDER — DEXAMETHASONE SODIUM PHOSPHATE 10 MG/ML IJ SOLN
10.0000 mg | Freq: Once | INTRAMUSCULAR | Status: AC
  Administered 2018-01-28: 10 mg via INTRAVENOUS

## 2018-01-28 MED ORDER — CEFAZOLIN SODIUM-DEXTROSE 1-4 GM/50ML-% IV SOLN
1.0000 g | Freq: Four times a day (QID) | INTRAVENOUS | Status: AC
  Administered 2018-01-28 (×2): 1 g via INTRAVENOUS
  Filled 2018-01-28 (×2): qty 50

## 2018-01-28 MED ORDER — CEFAZOLIN SODIUM-DEXTROSE 2-4 GM/100ML-% IV SOLN
2.0000 g | INTRAVENOUS | Status: AC
  Administered 2018-01-28: 2 g via INTRAVENOUS

## 2018-01-28 MED ORDER — LIDOCAINE 2% (20 MG/ML) 5 ML SYRINGE
INTRAMUSCULAR | Status: DC | PRN
  Administered 2018-01-28: 40 mg via INTRAVENOUS
  Administered 2018-01-28: 60 mg via INTRAVENOUS

## 2018-01-28 MED ORDER — CHLORHEXIDINE GLUCONATE 4 % EX LIQD: 60.0000 mL | Freq: Once | CUTANEOUS | Status: DC

## 2018-01-28 MED ORDER — PROPOFOL 10 MG/ML IV BOLUS
INTRAVENOUS | Status: DC | PRN
  Administered 2018-01-28: 150 mg via INTRAVENOUS

## 2018-01-28 MED ORDER — METHOCARBAMOL 500 MG PO TABS
500.0000 mg | ORAL_TABLET | Freq: Four times a day (QID) | ORAL | Status: DC | PRN
  Administered 2018-01-29 (×2): 500 mg via ORAL
  Filled 2018-01-28 (×3): qty 1

## 2018-01-28 MED ORDER — POLYETHYLENE GLYCOL 3350 17 G PO PACK: 17.0000 g | PACK | Freq: Every day | ORAL | Status: DC | PRN

## 2018-01-28 MED ORDER — MENTHOL 3 MG MT LOZG: 1.0000 | LOZENGE | OROMUCOSAL | Status: DC | PRN

## 2018-01-28 MED ORDER — HYDROCODONE-ACETAMINOPHEN 5-325 MG PO TABS
1.0000 | ORAL_TABLET | ORAL | Status: DC | PRN
  Administered 2018-01-28: 1 via ORAL
  Administered 2018-01-29 (×3): 2 via ORAL
  Filled 2018-01-28 (×3): qty 2
  Filled 2018-01-28: qty 1

## 2018-01-28 MED ORDER — FENTANYL CITRATE (PF) 250 MCG/5ML IJ SOLN
INTRAMUSCULAR | Status: AC
  Filled 2018-01-28: qty 5

## 2018-01-28 MED ORDER — DIPHENHYDRAMINE HCL 50 MG/ML IJ SOLN
INTRAMUSCULAR | Status: DC | PRN
  Administered 2018-01-28: 12.5 mg via INTRAVENOUS

## 2018-01-28 MED ORDER — SODIUM CHLORIDE 0.9 % IR SOLN
Status: DC | PRN
  Administered 2018-01-28: 1000 mL

## 2018-01-28 MED ORDER — LACTATED RINGERS IV SOLN
INTRAVENOUS | Status: DC
  Administered 2018-01-28 (×4): via INTRAVENOUS

## 2018-01-28 MED ORDER — PHENYLEPHRINE 40 MCG/ML (10ML) SYRINGE FOR IV PUSH (FOR BLOOD PRESSURE SUPPORT)
PREFILLED_SYRINGE | INTRAVENOUS | Status: DC | PRN
  Administered 2018-01-28 (×4): 80 ug via INTRAVENOUS

## 2018-01-28 MED ORDER — ACETAMINOPHEN 325 MG PO TABS: 325.0000 mg | ORAL_TABLET | Freq: Four times a day (QID) | ORAL | Status: DC | PRN

## 2018-01-28 MED ORDER — ONDANSETRON HCL 4 MG PO TABS: 4.0000 mg | ORAL_TABLET | Freq: Four times a day (QID) | ORAL | Status: DC | PRN

## 2018-01-28 MED ORDER — METOCLOPRAMIDE HCL 5 MG/ML IJ SOLN
INTRAMUSCULAR | Status: AC
  Filled 2018-01-28: qty 2

## 2018-01-28 MED ORDER — SCOPOLAMINE 1 MG/3DAYS TD PT72
MEDICATED_PATCH | TRANSDERMAL | Status: AC
  Filled 2018-01-28: qty 1

## 2018-01-28 MED ORDER — ROCURONIUM BROMIDE 10 MG/ML (PF) SYRINGE
PREFILLED_SYRINGE | INTRAVENOUS | Status: DC | PRN
  Administered 2018-01-28: 40 mg via INTRAVENOUS

## 2018-01-28 MED ORDER — ONDANSETRON HCL 4 MG/2ML IJ SOLN
INTRAMUSCULAR | Status: DC | PRN
  Administered 2018-01-28 (×2): 4 mg via INTRAVENOUS

## 2018-01-28 MED ORDER — METOCLOPRAMIDE HCL 5 MG/ML IJ SOLN: 10.0000 mg | Freq: Once | INTRAMUSCULAR | Status: DC | PRN

## 2018-01-28 MED ORDER — METHOCARBAMOL 1000 MG/10ML IJ SOLN
500.0000 mg | Freq: Four times a day (QID) | INTRAVENOUS | Status: DC | PRN
  Administered 2018-01-28: 500 mg via INTRAVENOUS
  Filled 2018-01-28: qty 5
  Filled 2018-01-28: qty 550

## 2018-01-28 MED ORDER — PHENOL 1.4 % MT LIQD: 1.0000 | OROMUCOSAL | Status: DC | PRN

## 2018-01-28 MED ORDER — ACETAMINOPHEN 10 MG/ML IV SOLN
1000.0000 mg | Freq: Once | INTRAVENOUS | Status: AC
  Administered 2018-01-28: 1000 mg via INTRAVENOUS
  Filled 2018-01-28: qty 100

## 2018-01-28 MED ORDER — ONDANSETRON HCL 4 MG/2ML IJ SOLN
4.0000 mg | Freq: Four times a day (QID) | INTRAMUSCULAR | Status: DC | PRN
  Administered 2018-01-28: 4 mg via INTRAVENOUS
  Filled 2018-01-28: qty 2

## 2018-01-28 MED ORDER — DIPHENHYDRAMINE HCL 12.5 MG/5ML PO ELIX: 12.5000 mg | ORAL_SOLUTION | ORAL | Status: DC | PRN

## 2018-01-28 MED ORDER — DEXAMETHASONE SODIUM PHOSPHATE 10 MG/ML IJ SOLN
10.0000 mg | Freq: Once | INTRAMUSCULAR | Status: AC
  Administered 2018-01-29: 10 mg via INTRAVENOUS
  Filled 2018-01-28: qty 1

## 2018-01-28 MED ORDER — LORATADINE 10 MG PO TABS: 10.0000 mg | ORAL_TABLET | Freq: Every day | ORAL | Status: DC | PRN

## 2018-01-28 MED ORDER — DOCUSATE SODIUM 100 MG PO CAPS
100.0000 mg | ORAL_CAPSULE | Freq: Two times a day (BID) | ORAL | Status: DC
  Administered 2018-01-28 – 2018-01-29 (×2): 100 mg via ORAL
  Filled 2018-01-28 (×2): qty 1

## 2018-01-28 MED ORDER — FLEET ENEMA 7-19 GM/118ML RE ENEM: 1.0000 | ENEMA | Freq: Once | RECTAL | Status: DC | PRN

## 2018-01-28 MED ORDER — MIDAZOLAM HCL 2 MG/2ML IJ SOLN
INTRAMUSCULAR | Status: DC | PRN
  Administered 2018-01-28: 2 mg via INTRAVENOUS

## 2018-01-28 MED ORDER — BISACODYL 10 MG RE SUPP: 10.0000 mg | Freq: Every day | RECTAL | Status: DC | PRN

## 2018-01-28 MED ORDER — CEFAZOLIN SODIUM-DEXTROSE 2-4 GM/100ML-% IV SOLN
INTRAVENOUS | Status: AC
  Filled 2018-01-28: qty 100

## 2018-01-28 MED ORDER — SODIUM CHLORIDE 0.9 % IV SOLN
INTRAVENOUS | Status: DC
  Administered 2018-01-28 – 2018-01-29 (×2): via INTRAVENOUS

## 2018-01-28 MED ORDER — BUPIVACAINE HCL (PF) 0.25 % IJ SOLN
INTRAMUSCULAR | Status: AC
  Filled 2018-01-28: qty 30

## 2018-01-28 SURGICAL SUPPLY — 35 items
BAG DECANTER FOR FLEXI CONT (MISCELLANEOUS) ×2 IMPLANT
BAG ZIPLOCK 12X15 (MISCELLANEOUS) IMPLANT
BLADE SAG 18X100X1.27 (BLADE) ×2 IMPLANT
CAPT HIP TOTAL 2 ×2 IMPLANT
CLOTH BEACON ORANGE TIMEOUT ST (SAFETY) ×2 IMPLANT
COVER PERINEAL POST (MISCELLANEOUS) ×2 IMPLANT
COVER SURGICAL LIGHT HANDLE (MISCELLANEOUS) ×2 IMPLANT
DECANTER SPIKE VIAL GLASS SM (MISCELLANEOUS) IMPLANT
DRAPE STERI IOBAN 125X83 (DRAPES) ×2 IMPLANT
DRAPE U-SHAPE 47X51 STRL (DRAPES) ×4 IMPLANT
DRSG ADAPTIC 3X8 NADH LF (GAUZE/BANDAGES/DRESSINGS) ×2 IMPLANT
DRSG MEPILEX BORDER 4X4 (GAUZE/BANDAGES/DRESSINGS) ×2 IMPLANT
DRSG MEPILEX BORDER 4X8 (GAUZE/BANDAGES/DRESSINGS) ×2 IMPLANT
DURAPREP 26ML APPLICATOR (WOUND CARE) ×2 IMPLANT
ELECT REM PT RETURN 15FT ADLT (MISCELLANEOUS) ×2 IMPLANT
EVACUATOR 1/8 PVC DRAIN (DRAIN) ×2 IMPLANT
GLOVE BIO SURGEON STRL SZ7.5 (GLOVE) ×2 IMPLANT
GLOVE BIO SURGEON STRL SZ8 (GLOVE) ×4 IMPLANT
GLOVE BIOGEL PI IND STRL 7.0 (GLOVE) ×6 IMPLANT
GLOVE BIOGEL PI IND STRL 8 (GLOVE) ×2 IMPLANT
GLOVE BIOGEL PI INDICATOR 7.0 (GLOVE) ×6
GLOVE BIOGEL PI INDICATOR 8 (GLOVE) ×2
GOWN STRL REUS W/TWL LRG LVL3 (GOWN DISPOSABLE) ×2 IMPLANT
GOWN STRL REUS W/TWL XL LVL3 (GOWN DISPOSABLE) ×8 IMPLANT
PACK ANTERIOR HIP CUSTOM (KITS) ×2 IMPLANT
STRIP CLOSURE SKIN 1/2X4 (GAUZE/BANDAGES/DRESSINGS) ×2 IMPLANT
SUT ETHIBOND NAB CT1 #1 30IN (SUTURE) ×2 IMPLANT
SUT MNCRL AB 4-0 PS2 18 (SUTURE) ×2 IMPLANT
SUT STRATAFIX 0 PDS 27 VIOLET (SUTURE) ×2
SUT VIC AB 2-0 CT1 27 (SUTURE) ×2
SUT VIC AB 2-0 CT1 TAPERPNT 27 (SUTURE) ×2 IMPLANT
SUTURE STRATFX 0 PDS 27 VIOLET (SUTURE) ×1 IMPLANT
SYR 50ML LL SCALE MARK (SYRINGE) IMPLANT
TRAY FOLEY CATH 14FR (SET/KITS/TRAYS/PACK) ×2 IMPLANT
YANKAUER SUCT BULB TIP 10FT TU (MISCELLANEOUS) ×2 IMPLANT

## 2018-01-28 NOTE — Anesthesia Procedure Notes (Signed)
Procedure Name: Intubation Date/Time: 01/28/2018 9:58 AM Performed by: Cynda Familia, CRNA Pre-anesthesia Checklist: Patient identified, Emergency Drugs available, Suction available and Patient being monitored Patient Re-evaluated:Patient Re-evaluated prior to induction Oxygen Delivery Method: Circle System Utilized Preoxygenation: Pre-oxygenation with 100% oxygen Induction Type: IV induction Ventilation: Mask ventilation without difficulty Laryngoscope Size: Miller and 2 Grade View: Grade I Tube type: Oral Tube size: 7.0 mm Number of attempts: 1 Airway Equipment and Method: Stylet Placement Confirmation: ETT inserted through vocal cords under direct vision,  positive ETCO2 and breath sounds checked- equal and bilateral Secured at: 21 cm Tube secured with: Tape Dental Injury: Teeth and Oropharynx as per pre-operative assessment  Comments: Smooth IV induction Foster-- intubation AM CRNA atraumatic-- teeth and mouth as preop-- irregular surfaces to front teeth-- unchanged after laryngoscopy--- bilat BS Royce Macadamia

## 2018-01-28 NOTE — Discharge Instructions (Addendum)
° °Dr. Frank Aluisio °Total Joint Specialist °[facility] °[address] °[place], [place] [address] °[phone] ° °ANTERIOR APPROACH TOTAL HIP REPLACEMENT POSTOPERATIVE DIRECTIONS ° ° °Hip Rehabilitation, Guidelines Following Surgery  °The results of a hip operation are greatly improved after range of motion and muscle strengthening exercises. Follow all safety measures which are given to protect your hip. If any of these exercises cause increased pain or swelling in your joint, decrease the amount until you are comfortable again. Then slowly increase the exercises. Call your caregiver if you have problems or questions.  ° °HOME CARE INSTRUCTIONS  °Remove items at home which could result in a fall. This includes throw rugs or furniture in walking pathways.  °· ICE to the affected hip every three hours for 30 minutes at a time and then as needed for pain and swelling.  Continue to use ice on the hip for pain and swelling from surgery. You may notice swelling that will progress down to the foot and ankle.  This is normal after surgery.  Elevate the leg when you are not up walking on it.   °· Continue to use the breathing machine which will help keep your temperature down.  It is common for your temperature to cycle up and down following surgery, especially at night when you are not up moving around and exerting yourself.  The breathing machine keeps your lungs expanded and your temperature down. ° ° °DIET °You may resume your previous home diet once your are discharged from the hospital. ° °DRESSING / WOUND CARE / SHOWERING °You may change your dressing every day with sterile gauze.  Please use good hand washing techniques before changing the dressing.  Do not use any lotions or creams on the incision until instructed by your surgeon. °You may start showering once you are discharged home but do not submerge the incision under water. Just pat the incision dry and apply a dry gauze dressing on  daily. °Change the surgical dressing daily and reapply a dry dressing each time. ° °ACTIVITY °Walk with your walker as instructed. °Use walker as long as suggested by your caregivers. °Avoid periods of inactivity such as sitting longer than an hour when not asleep. This helps prevent blood clots.  °You may resume a sexual relationship in one month or when given the OK by your doctor.  °You may return to work once you are cleared by your doctor.  °Do not drive a car for 6 weeks or until released by you surgeon.  °Do not drive while taking narcotics. ° °WEIGHT BEARING °Weight bearing as tolerated with assist device (walker, cane, etc) as directed, use it as long as suggested by your surgeon or therapist, typically at least 4-6 weeks. ° °POSTOPERATIVE CONSTIPATION PROTOCOL °Constipation - defined medically as fewer than three stools per week and severe constipation as less than one stool per week. ° °One of the most common issues patients have following surgery is constipation.  Even if you have a regular bowel pattern at home, your normal regimen is likely to be disrupted due to multiple reasons following surgery.  Combination of anesthesia, postoperative narcotics, change in appetite and fluid intake all can affect your bowels.  In order to avoid complications following surgery, here are some recommendations in order to help you during your recovery period. ° °Colace (docusate) - Pick up an over-the-counter form of Colace or another stool softener and take twice a day as long as you are requiring postoperative pain   medications.  Take with a full glass of water daily.  If you experience loose stools or diarrhea, hold the colace until you stool forms back up.  If your symptoms do not get better within 1 week or if they get worse, check with your doctor. ° °Dulcolax (bisacodyl) - Pick up over-the-counter and take as directed by the product packaging as needed to assist with the movement of your bowels.  Take with a full  glass of water.  Use this product as needed if not relieved by Colace only.  ° °MiraLax (polyethylene glycol) - Pick up over-the-counter to have on hand.  MiraLax is a solution that will increase the amount of water in your bowels to assist with bowel movements.  Take as directed and can mix with a glass of water, juice, soda, coffee, or tea.  Take if you go more than two days without a movement. °Do not use MiraLax more than once per day. Call your doctor if you are still constipated or irregular after using this medication for 7 days in a row. ° °If you continue to have problems with postoperative constipation, please contact the office for further assistance and recommendations.  If you experience "the worst abdominal pain ever" or develop nausea or vomiting, please contact the office immediatly for further recommendations for treatment. ° °ITCHING ° If you experience itching with your medications, try taking only a single pain pill, or even half a pain pill at a time.  You can also use Benadryl over the counter for itching or also to help with sleep.  ° °TED HOSE STOCKINGS °Wear the elastic stockings on both legs for three weeks following surgery during the day but you may remove then at night for sleeping. ° °MEDICATIONS °See your medication summary on the “After Visit Summary” that the nursing staff will review with you prior to discharge.  You may have some home medications which will be placed on hold until you complete the course of blood thinner medication.  It is important for you to complete the blood thinner medication as prescribed by your surgeon.  Continue your approved medications as instructed at time of discharge. ° °PRECAUTIONS °If you experience chest pain or shortness of breath - call 911 immediately for transfer to the hospital emergency department.  °If you develop a fever greater that 101 F, purulent drainage from wound, increased redness or drainage from wound, foul odor from the  wound/dressing, or calf pain - CONTACT YOUR SURGEON.   °                                                °FOLLOW-UP APPOINTMENTS °Make sure you keep all of your appointments after your operation with your surgeon and caregivers. You should call the office at the above phone number and make an appointment for approximately two weeks after the date of your surgery or on the date instructed by your surgeon outlined in the "After Visit Summary". ° °RANGE OF MOTION AND STRENGTHENING EXERCISES  °These exercises are designed to help you keep full movement of your hip joint. Follow your caregiver's or physical therapist's instructions. Perform all exercises about fifteen times, three times per day or as directed. Exercise both hips, even if you have had only one joint replacement. These exercises can be done on a training (exercise) mat, on the floor, on   a table or on a bed. Use whatever works the best and is most comfortable for you. Use music or television while you are exercising so that the exercises are a pleasant break in your day. This will make your life better with the exercises acting as a break in routine you can look forward to.  °Lying on your back, slowly slide your foot toward your buttocks, raising your knee up off the floor. Then slowly slide your foot back down until your leg is straight again.  °Lying on your back spread your legs as far apart as you can without causing discomfort.  °Lying on your side, raise your upper leg and foot straight up from the floor as far as is comfortable. Slowly lower the leg and repeat.  °Lying on your back, tighten up the muscle in the front of your thigh (quadriceps muscles). You can do this by keeping your leg straight and trying to raise your heel off the floor. This helps strengthen the largest muscle supporting your knee.  °Lying on your back, tighten up the muscles of your buttocks both with the legs straight and with the knee bent at a comfortable angle while keeping  your heel on the floor.  ° °IF YOU ARE TRANSFERRED TO A SKILLED REHAB FACILITY °If the patient is transferred to a skilled rehab facility following release from the hospital, a list of the current medications will be sent to the facility for the patient to continue.  When discharged from the skilled rehab facility, please have the facility set up the patient's Home Health Physical Therapy prior to being released. Also, the skilled facility will be responsible for providing the patient with their medications at time of release from the facility to include their pain medication, the muscle relaxants, and their blood thinner medication. If the patient is still at the rehab facility at time of the two week follow up appointment, the skilled rehab facility will also need to assist the patient in arranging follow up appointment in our office and any transportation needs. ° °MAKE SURE YOU:  °Understand these instructions.  °Get help right away if you are not doing well or get worse.  ° ° °Pick up stool softner and laxative for home use following surgery while on pain medications. °Do not submerge incision under water. °Please use good hand washing techniques while changing dressing each day. °May shower starting three days after surgery. °Please use a clean towel to pat the incision dry following showers. °Continue to use ice for pain and swelling after surgery. °Do not use any lotions or creams on the incision until instructed by your surgeon. °

## 2018-01-28 NOTE — Op Note (Signed)
OPERATIVE REPORT- TOTAL HIP ARTHROPLASTY   PREOPERATIVE DIAGNOSIS: Osteoarthritis of the Left hip.   POSTOPERATIVE DIAGNOSIS: Osteoarthritis of the Left  hip.   PROCEDURE: Left total hip arthroplasty, anterior approach.   SURGEON: Gaynelle Arabian, MD   ASSISTANT: Molli Barrows, PA-C  ANESTHESIA:  General  ESTIMATED BLOOD LOSS:-400 mL    DRAINS: Hemovac x1.   COMPLICATIONS: None   CONDITION: PACU - hemodynamically stable.   BRIEF CLINICAL NOTE: [name] is a 57 y.o. female who has advanced end-  stage arthritis of their Left  hip with progressively worsening pain and  dysfunction.The patient has failed nonoperative management and presents for  total hip arthroplasty.   PROCEDURE IN DETAIL: After successful administration of spinal  anesthetic, the traction boots for the [facility] bed were placed on both  feet and the patient was placed onto the [facility] bed, boots placed into the leg  holders. The Left hip was then isolated from the perineum with plastic  drapes and prepped and draped in the usual sterile fashion. ASIS and  greater trochanter were marked and a oblique incision was made, starting  at about 1 cm lateral and 2 cm distal to the ASIS and coursing towards  the anterior cortex of the femur. The skin was cut with a 10 blade  through subcutaneous tissue to the level of the fascia overlying the  tensor fascia lata muscle. The fascia was then incised in line with the  incision at the junction of the anterior third and posterior 2/3rd. The  muscle was teased off the fascia and then the interval between the TFL  and the rectus was developed. The Hohmann retractor was then placed at  the top of the femoral neck over the capsule. The vessels overlying the  capsule were cauterized and the fat on top of the capsule was removed.  A Hohmann retractor was then placed anterior underneath the rectus  femoris to give exposure to the entire anterior capsule. A T-shaped   capsulotomy was performed. The edges were tagged and the femoral head  was identified.       Osteophytes are removed off the superior acetabulum.  The femoral neck was then cut in situ with an oscillating saw. Traction  was then applied to the left lower extremity utilizing the [facility]  traction. The femoral head was then removed. Retractors were placed  around the acetabulum and then circumferential removal of the labrum was  performed. Osteophytes were also removed. Reaming starts at 45 mm to  medialize and  Increased in 2 mm increments to 47 mm. We reamed in  approximately 40 degrees of abduction, 20 degrees anteversion. A 48 mm  pinnacle acetabular shell was then impacted in anatomic position under  fluoroscopic guidance with excellent purchase. We did not need to place  any additional dome screws. A 28 mm neutral + 4 marathon liner was then  placed into the acetabular shell.       The femoral lift was then placed along the lateral aspect of the femur  just distal to the vastus ridge. The leg was  externally rotated and capsule  was stripped off the inferior aspect of the femoral neck down to the  level of the lesser trochanter, this was done with electrocautery. The femur was lifted after this was performed. The  leg was then placed in an extended and adducted position essentially delivering the femur. We also removed the capsule superiorly and the piriformis from the piriformis  fossa to gain excellent exposure of the  proximal femur. Rongeur was used to remove some cancellous bone to get  into the lateral portion of the proximal femur for placement of the  initial starter reamer. The starter broaches was placed  the starter broach  and was shown to go down the center of the canal. Broaching  with the  Corail system was then performed starting at size 8, coursing  Up to size 10. A size 10 had excellent torsional and rotational  and axial stability. The trial standard offset neck was then  placed  with a 28 + 1.5 trial head. The hip was then reduced. We confirmed that  the stem was in the canal both on AP and lateral x-rays. It also has excellent sizing. The hip was reduced with outstanding stability through full extension and full external rotation.. AP pelvis was taken and the leg lengths were measured and found to be within 2 mm of equal. Hip was then dislocated again and the femoral head and neck removed. The  femoral broach was removed. Size 10 Corail stem with a standard offset  neck was then impacted into the femur following native anteversion. Has  excellent purchase in the canal. Excellent torsional and rotational and  axial stability. It is confirmed to be in the canal on AP and lateral  fluoroscopic views. The 28 + 1.5 ceramic head was placed and the hip  reduced with outstanding stability. Again AP pelvis was taken and it  confirmed that the leg lengths were within 2 mm of equal. The wound was then copiously  irrigated with saline solution and the capsule reattached and repaired  with Ethibond suture. 30 ml of .25% Bupivicaine was  injected into the capsule and into the edge of the tensor fascia lata as well as subcutaneous tissue. The fascia overlying the tensor fascia lata was then closed with a running #1 V-Loc. Subcu was closed with interrupted 2-0 Vicryl and subcuticular running 4-0 Monocryl. Incision was cleaned  and dried. Steri-Strips and a bulky sterile dressing applied. Hemovac  drain was hooked to suction and then the patient was awakened and transported to  recovery in stable condition.        Please note that a surgical assistant was a medical necessity for this procedure to perform it in a safe and expeditious manner. Assistant was necessary to provide appropriate retraction of vital neurovascular structures and to prevent femoral fracture and allow for anatomic placement of the prosthesis.  Gaynelle Arabian, M.D.

## 2018-01-28 NOTE — Plan of Care (Signed)
Plan of care 

## 2018-01-28 NOTE — Anesthesia Postprocedure Evaluation (Signed)
Anesthesia Post Note  Patient: [name]  Procedure(s) Performed: LEFT TOTAL HIP ARTHROPLASTY ANTERIOR APPROACH (Left Hip)     Patient location during evaluation: PACU Anesthesia Type: General Level of consciousness: awake and alert and oriented Pain management: pain level controlled Vital Signs Assessment: post-procedure vital signs reviewed and stable Respiratory status: spontaneous breathing, nonlabored ventilation, respiratory function stable and patient connected to nasal cannula oxygen Cardiovascular status: blood pressure returned to baseline and stable Postop Assessment: no apparent nausea or vomiting Anesthetic complications: no    Last Vitals:  Vitals:   01/28/18 1315 01/28/18 1330  BP: (!) 120/98 140/73  Pulse: 87 75  Resp: 13 14  Temp:    SpO2: 98% 98%    Last Pain:  Vitals:   01/28/18 1330  TempSrc:   PainSc: 8                  Khrista Braun A.

## 2018-01-28 NOTE — Anesthesia Procedure Notes (Addendum)
Date/Time: 01/28/2018 12:00 PM Performed by: Cynda Familia, CRNA Oxygen Delivery Method: Simple face mask Placement Confirmation: breath sounds checked- equal and bilateral and positive ETCO2 Dental Injury: Teeth and Oropharynx as per pre-operative assessment

## 2018-01-28 NOTE — Transfer of Care (Signed)
Immediate Anesthesia Transfer of Care Note  Patient: [name]  Procedure(s) Performed: LEFT TOTAL HIP ARTHROPLASTY ANTERIOR APPROACH (Left Hip)  Patient Location: PACU  Anesthesia Type:General  Level of Consciousness: sedated  Airway & Oxygen Therapy: Patient Spontanous Breathing and Patient connected to face mask oxygen  Post-op Assessment: Report given to RN and Post -op Vital signs reviewed and stable  Post vital signs: Reviewed and stable  Last Vitals:  Vitals Value Taken Time  BP    Temp    Pulse    Resp 8 01/28/2018 12:07 PM  SpO2    Vitals shown include unvalidated device data.  Last Pain:  Vitals:   01/28/18 0821  TempSrc: Oral      Patients Stated Pain Goal: 4 (71/16/57 [id number])  Complications: No apparent anesthesia complications

## 2018-01-28 NOTE — Interval H&P Note (Signed)
History and Physical Interval Note:  01/28/2018 8:44 AM  [name]  has presented today for surgery, with the diagnosis of Osteoarthritis Left hip  The various methods of treatment have been discussed with the patient and family. After consideration of risks, benefits and other options for treatment, the patient has consented to  Procedure(s): LEFT TOTAL HIP ARTHROPLASTY ANTERIOR APPROACH (Left) as a surgical intervention .  The patient's history has been reviewed, patient examined, no change in status, stable for surgery.  I have reviewed the patient's chart and labs.  Questions were answered to the patient's satisfaction.     Pilar Plate Donnarae Rae  History and Physical Interval Note:  01/28/2018 8:44 AM  [name]  has presented today for surgery, with the diagnosis of Osteoarthritis Left hip  The various methods of treatment have been discussed with the patient and family. After consideration of risks, benefits and other options for treatment, the patient has consented to  Procedure(s): LEFT TOTAL HIP ARTHROPLASTY ANTERIOR APPROACH (Left) as a surgical intervention .  The patient's history has been reviewed, patient examined, no change in status, stable for surgery.  I have reviewed the patient's chart and labs.  Questions were answered to the patient's satisfaction.     Pilar Plate Briellah Baik

## 2018-01-28 NOTE — Anesthesia Preprocedure Evaluation (Addendum)
Anesthesia Evaluation  Patient identified by MRN, date of birth, ID band Patient awake    Reviewed: Allergy & Precautions, NPO status , Patient's Chart, lab work & pertinent test results, reviewed documented beta blocker date and time   History of Anesthesia Complications (+) PONV and history of anesthetic complications  Airway Mallampati: III  TM Distance: >3 FB Neck ROM: Full    Dental no notable dental hx. (+) Teeth Intact, Dental Advisory Given   Pulmonary sleep apnea ,  OSA improved since pituitary adenoma resected   Pulmonary exam normal breath sounds clear to auscultation       Cardiovascular negative cardio ROS Normal cardiovascular exam Rhythm:Regular Rate:Normal     Neuro/Psych Hx/o pituitary adenoma S/P resection 2013 negative neurological ROS  negative psych ROS   GI/Hepatic negative GI ROS, Neg liver ROS, GERD  Medicated and Controlled,  Endo/Other  Obesity  Renal/GU negative Renal ROS  negative genitourinary   Musculoskeletal  (+) Arthritis , Osteoarthritis,  OA left hip   Abdominal (+) + obese,   Peds  Hematology negative hematology ROS (+)   Anesthesia Other Findings   Reproductive/Obstetrics                              Chemistry      Component Value Date/Time   NA 141 10/25/2016 0426   K 3.8 10/25/2016 0426   CL 108 10/25/2016 0426   CO2 28 10/25/2016 0426   BUN 16 10/25/2016 0426   CREATININE 0.67 10/25/2016 0426      Component Value Date/Time   CALCIUM 8.4 (L) 10/25/2016 0426   ALKPHOS 64 01/21/2018 1513   AST 19 01/21/2018 1513   ALT 19 01/21/2018 1513   BILITOT 0.4 01/21/2018 1513     Lab Results  Component Value Date   WBC 10.7 (H) 10/25/2016   HGB 10.9 (L) 10/25/2016   HCT 33.1 (L) 10/25/2016   MCV 92.5 10/25/2016   PLT 211 10/25/2016    Anesthesia Physical  Anesthesia Plan  ASA: II  Anesthesia Plan: General   Post-op Pain Management:     Induction:   PONV Risk Score and Plan:   Airway Management Planned: Oral ETT  Additional Equipment:   Intra-op Plan:   Post-operative Plan: Extubation in OR  Informed Consent: I have reviewed the patients History and Physical, chart, labs and discussed the procedure including the risks, benefits and alternatives for the proposed anesthesia with the patient or authorized representative who has indicated his/her understanding and acceptance.   Dental advisory given  Plan Discussed with: Anesthesiologist, CRNA and Surgeon  Anesthesia Plan Comments:         Anesthesia Quick Evaluation

## 2018-01-29 LAB — BASIC METABOLIC PANEL
Anion gap: 7 (ref 5–15)
BUN: 10 mg/dL (ref 6–20)
CO2: 25 mmol/L (ref 22–32)
Calcium: 8.3 mg/dL — ABNORMAL LOW (ref 8.9–10.3)
Chloride: 109 mmol/L (ref 101–111)
Creatinine, Ser: 0.7 mg/dL (ref 0.44–1.00)
GFR calc Af Amer: >60 mL/min (ref >60)
GFR calc non Af Amer: >60 mL/min (ref >60)
Glucose, Bld: 145 mg/dL — ABNORMAL HIGH (ref 65–99)
Potassium: 3.8 mmol/L (ref 3.5–5.1)
Sodium: 141 mmol/L (ref 135–145)

## 2018-01-29 LAB — CBC
HCT: 33.1 % — ABNORMAL LOW (ref 36.0–46.0)
Hemoglobin: 10.9 g/dL — ABNORMAL LOW (ref 12.0–15.0)
MCH: 30.4 pg (ref 26.0–34.0)
MCHC: 32.9 g/dL (ref 30.0–36.0)
MCV: 92.2 fL (ref 78.0–100.0)
Platelets: 255 10*3/uL (ref 150–400)
RBC: 3.59 MIL/uL — ABNORMAL LOW (ref 3.87–5.11)
RDW: 13.3 % (ref 11.5–15.5)
WBC: 11 10*3/uL — ABNORMAL HIGH (ref 4.0–10.5)

## 2018-01-29 MED ORDER — TRAMADOL HCL 50 MG PO TABS: 50.0000 mg | ORAL_TABLET | Freq: Four times a day (QID) | ORAL | 0 refills | Status: DC | PRN

## 2018-01-29 MED ORDER — ALUM & MAG HYDROXIDE-SIMETH 200-200-20 MG/5ML PO SUSP
15.0000 mL | ORAL | Status: DC | PRN
Start: 2018-01-29 — End: 2018-01-29
  Administered 2018-01-29: 15 mL via ORAL
  Filled 2018-01-29: qty 30

## 2018-01-29 MED ORDER — SODIUM CHLORIDE 0.9 % IV BOLUS
250.0000 mL | Freq: Once | INTRAVENOUS | Status: AC
  Administered 2018-01-29: 250 mL via INTRAVENOUS

## 2018-01-29 MED ORDER — ASPIRIN 325 MG PO TBEC: 325.0000 mg | DELAYED_RELEASE_TABLET | Freq: Every day | ORAL | 0 refills | Status: DC

## 2018-01-29 MED ORDER — HYDROCODONE-ACETAMINOPHEN 7.5-325 MG PO TABS: 1.0000 | ORAL_TABLET | ORAL | 0 refills | Status: DC | PRN

## 2018-01-29 NOTE — Progress Notes (Signed)
RN reviewed discharge instructions with patient and family. All questions answered.   Paperwork and prescriptions given.   NT rolled patient down with all belongings to family car. 

## 2018-01-29 NOTE — Progress Notes (Addendum)
PT--[place]   01/29/18 1254  PT Visit Information  Assistance Needed +1, ready for d/c, RN made aware  History of Present Illness s/p R THA; PMH: L THA  Subjective Data  Patient Stated Goal go home today  Precautions  Precautions Fall  Restrictions  Weight Bearing Restrictions No  Other Position/Activity Restrictions WBAT  Pain Assessment  Pain Location L hip  Pain Descriptors / Indicators Sore;Grimacing;Guarding  Cognition  Arousal/Alertness Awake/alert  Behavior During Therapy WFL for tasks assessed/performed  Overall Cognitive Status Within Functional Limits for tasks assessed  Bed Mobility  General bed mobility comments NT--pt in chair  Total Joint Exercises  Quad Sets AROM;Both;10 reps  Heel Slides AAROM;Left;10 reps  Hip ABduction/ADduction AAROM;AROM;10 reps;Left  Short Arc Quad AROM;Left;10 reps  Ankle Circles/Pumps AROM;Both;10 reps  PT - End of Session  Activity Tolerance Patient tolerated treatment well  Patient left with call bell/phone within reach;in chair;with family/visitor present  Nurse Communication Mobility status   PT - Assessment/Plan  PT Plan Current plan remains appropriate  PT Visit Diagnosis Difficulty in walking, not elsewhere classified (R26.2)  PT Frequency (ACUTE ONLY) 7X/week  Follow Up Recommendations Home health PT  PT equipment None recommended by PT  AM-PAC PT "6 Clicks" Daily Activity Outcome Measure  Difficulty turning over in bed (including adjusting bedclothes, sheets and blankets)? 3  Difficulty moving from lying on back to sitting on the side of the bed?  3  Difficulty sitting down on and standing up from a chair with arms (e.g., wheelchair, bedside commode, etc,.)? 3  Help needed moving to and from a bed to chair (including a wheelchair)? 3  Help needed walking in hospital room? 3  Help needed climbing 3-5 steps with a railing?  3  6 Click Score 18  Mobility G Code  CK  Acute Rehab PT Goals  PT Goal Formulation With patient  Time  For Goal Achievement 02/05/18  Potential to Achieve Goals Good  PT Time Calculation  PT Start Time (ACUTE ONLY) 1220  PT Stop Time (ACUTE ONLY) 1239  PT Time Calculation (min) (ACUTE ONLY) 19 min  PT General Charges  $$ ACUTE PT VISIT 1 Visit  PT Treatments  $Therapeutic Exercise 8-22 mins

## 2018-01-29 NOTE — Progress Notes (Addendum)
   Subjective: 1 Day Post-Op Procedure(s) (LRB): LEFT TOTAL HIP ARTHROPLASTY ANTERIOR APPROACH (Left) Patient reports pain as mild.   Patient seen in rounds with Dr. Wynelle Link. Patient is well, and has had no acute complaints or problems. Had some problems with nausea and vomiting last night but doing better this morning. Voiding well.  We will start therapy today.  Plan is to go Home after hospital stay.  Objective: Vital signs in last 24 hours: Temp:  [97.6 F (36.4 C)-98.4 F (36.9 C)] 98.4 F (36.9 C) (04/25 0501) Pulse Rate:  [74-102] 76 (04/25 0501) Resp:  [9-30] 15 (04/25 0501) BP: (113-143)/(53-98) 113/61 (04/25 0501) SpO2:  [96 %-100 %] 97 % (04/25 0501) Weight:  [75.3 kg (166 lb)] 75.3 kg (166 lb) (04/24 1415)  Intake/Output from previous day:  Intake/Output Summary (Last 24 hours) at 01/29/2018 0822 Last data filed at 01/29/2018 0645 Gross per 24 hour  Intake 4135 ml  Output 2675 ml  Net 1460 ml     Labs: Recent Labs    01/29/18 0528  HGB 10.9*   Recent Labs    01/29/18 0528  WBC 11.0*  RBC 3.59*  HCT 33.1*  PLT 255   Recent Labs    01/29/18 0528  NA 141  K 3.8  CL 109  CO2 25  BUN 10  CREATININE 0.70  GLUCOSE 145*  CALCIUM 8.3*    EXAM General - Patient is Alert and Oriented Extremity - Neurologically intact Intact pulses distally Dorsiflexion/Plantar flexion intact Compartment soft Dressing - dressing C/D/I Motor Function - intact, moving foot and toes well on exam.  Hemovac pulled without difficulty.  Past Medical History:  Diagnosis Date  . 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 Complication of anesthesia   . GERD (gastroesophageal reflux disease)   . OSA (obstructive sleep apnea)    cpap does not use since brain tumore rmoved breathes better  . Osteoarthritis   . Osteoarthritis   . PONV (postoperative nausea and vomiting)     Assessment/Plan: 1 Day Post-Op Procedure(s) (LRB): LEFT TOTAL HIP ARTHROPLASTY ANTERIOR  APPROACH (Left) Active Problems:   OA (osteoarthritis) of hip  Estimated body mass index is 30.36 kg/m as calculated from the following:   Height as of this encounter: 5\' 2"  (1.575 m).   Weight as of this encounter: 75.3 kg (166 lb). Advance diet Up with therapy  DVT Prophylaxis - Aspirin Weight Bearing As Tolerated  Hemovac Pulled Begin Therapy  Will start therapy today. She will have two sessions. Plan for DC home this afternoon if she progresses well. Follow up in 2 weeks.   Ardeen Jourdain, PA-C Orthopaedic Surgery 01/29/2018, 8:22 AM

## 2018-01-29 NOTE — Evaluation (Signed)
Physical Therapy Evaluation Patient Details Name: [name] MRN: [id number] DOB: 03-08-1961 Today's Date: 01/29/2018   History of Present Illness  s/p R THA; PMH: L THA  Clinical Impression  Pt is s/p THA resulting in the deficits listed below (see PT Problem List). Pt progressing well, will see second session and will be ready for d/c; Pt will benefit from skilled PT to increase their independence and safety with mobility to allow discharge to the venue listed below.      Follow Up Recommendations Home health PT    Equipment Recommendations  None recommended by PT    Recommendations for Other Services       Precautions / Restrictions Precautions Precautions: Fall Restrictions Weight Bearing Restrictions: No Other Position/Activity Restrictions: WBAT      Mobility  Bed Mobility               General bed mobility comments: NT--pt in chair  Transfers Overall transfer level: Needs assistance Equipment used: Rolling walker (2 wheeled) Transfers: Sit to/from Stand Sit to Stand: Min guard;Supervision         General transfer comment: cues for hand placement, transfers from chair and toilet  Ambulation/Gait Ambulation/Gait assistance: Supervision;Min guard Ambulation Distance (Feet): 160 Feet Assistive device: Rolling walker (2 wheeled) Gait Pattern/deviations: Step-through pattern;Decreased stance time - left     General Gait Details: cues for sequence  Stairs Stairs: Yes Stairs assistance: Min guard;Min assist Stair Management: Step to pattern;Backwards;With walker Number of Stairs: 1 General stair comments: cues for sequence adn safe technique  Wheelchair Mobility    Modified Rankin (Stroke Patients Only)       Balance                                             Pertinent Vitals/Pain Pain Assessment: 0-10 Pain Score: 2  Pain Location: L hip Pain Descriptors / Indicators: Sore Pain Intervention(s): Monitored  during session;Limited activity within patient's tolerance;Premedicated before session    Home Living Family/patient expects to be discharged to:: (P) Private residence Living Arrangements: (P) Spouse/significant other Available Help at Discharge: Family Type of Home: House Home Access: Stairs to enter   Technical brewer of Steps: 1 Home Layout: Able to live on main level with bedroom/bathroom Home Equipment: Walker - 2 wheels;Bedside commode;Walker - 4 wheels      Prior Function Level of Independence: Independent               Hand Dominance        Extremity/Trunk Assessment   Upper Extremity Assessment Upper Extremity Assessment: Overall WFL for tasks assessed    Lower Extremity Assessment Lower Extremity Assessment: LLE deficits/detail LLE Deficits / Details: grossly 3/5, A/AROM WFL       Communication   Communication: No difficulties  Cognition Arousal/Alertness: Awake/alert Behavior During Therapy: WFL for tasks assessed/performed Overall Cognitive Status: Within Functional Limits for tasks assessed                                        General Comments      Exercises     Assessment/Plan    PT Assessment Patient needs continued PT services  PT Problem List Decreased mobility;Pain       PT Treatment Interventions DME instruction;Gait training;Therapeutic exercise;Patient/family education;Therapeutic  activities;Functional mobility training    PT Goals (Current goals can be found in the Care Plan section)  Acute Rehab PT Goals Patient Stated Goal: go home today PT Goal Formulation: With patient Time For Goal Achievement: 02/05/18 Potential to Achieve Goals: Good    Frequency     Barriers to discharge        Co-evaluation               AM-PAC PT "6 Clicks" Daily Activity  Outcome Measure Difficulty turning over in bed (including adjusting bedclothes, sheets and blankets)?: A Little Difficulty moving from  lying on back to sitting on the side of the bed? : A Little Difficulty sitting down on and standing up from a chair with arms (e.g., wheelchair, bedside commode, etc,.)?: A Little Help needed moving to and from a bed to chair (including a wheelchair)?: A Little Help needed walking in hospital room?: A Little Help needed climbing 3-5 steps with a railing? : A Little 6 Click Score: 18    End of Session Equipment Utilized During Treatment: Gait belt Activity Tolerance: Patient tolerated treatment well Patient left: with call bell/phone within reach;in chair;with family/visitor present Nurse Communication: Mobility status PT Visit Diagnosis: Difficulty in walking, not elsewhere classified (R26.2)    Time: 1117-1140 PT Time Calculation (min) (ACUTE ONLY): 23 min   Charges:   PT Evaluation $PT Eval Low Complexity: 1 Low PT Treatments $Gait Training: 8-22 mins   PT G CodesKenyon Ana, PT Pager: [phone] 01/29/2018   Kenyon Ana 01/29/2018, 12:01 PM

## 2018-01-29 NOTE — Discharge Summary (Signed)
Physician Discharge Summary   Patient ID: [name] MRN: [id number] DOB/AGE: Sep 24, 1961 57 y.o.  Admit date: 01/28/2018 Discharge date: 01/29/2018  Primary Diagnosis: Primary osteoarthritis left hip  Admission Diagnoses:  Past Medical History:  Diagnosis Date  . 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 Complication of anesthesia   . GERD (gastroesophageal reflux disease)   . OSA (obstructive sleep apnea)    cpap does not use since brain tumore rmoved breathes better  . Osteoarthritis   . Osteoarthritis   . PONV (postoperative nausea and vomiting)    Discharge Diagnoses:   Active Problems:   OA (osteoarthritis) of hip  Estimated body mass index is 30.36 kg/m as calculated from the following:   Height as of this encounter: 5' 2"  (1.575 m).   Weight as of this encounter: 75.3 kg (166 lb).  Procedure(s) (LRB): LEFT TOTAL HIP ARTHROPLASTY ANTERIOR APPROACH (Left)   Consults: None  HPI: [name] is a year out from her right total hip replacement. She is very pleased with her right hip. She has no pain in the right hip. However, she is having a lot of trouble with the left hip. She is having groin pain and lateral hip pain that bothers her both with activity and at night. She had a IA cortisone injection in her left hip in June 2018. It did help for a few months. She is wanting to move forward with having the left hip replaced. The LEFT hip has gotten progressively worse over the past few months. It is hurting almost as bad as the RIGHT one did prior to when she had that replaced. She is having significant functional limitations because of the LEFT hip. She really wants to have a better quality-of-life so would like to have this LEFT hip fixed. AP pelvis AP and lateral of the hips show that the prosthesis on the RIGHT is in excellent position with no periprosthetic abnormalities. On the LEFT she does have bone-on-bone arthritis in the hip joint with some subchondral cystic  formation and marginal osteophytes. She has significant arthritis at LEFT hip with progressively worsening pain and dysfunction. At this point the most predictable means of improving her pain and function will be total hip arthroplasty. She is very familiar with her from having had the other side done last year. She has end-stage her she is ready to go ahead and proceed.     Laboratory Data: Admission on 01/28/2018, Discharged on 01/29/2018  Component Date Value Ref Range Status  . WBC 01/29/2018 11.0* 4.0 - 10.5 K/uL Final  . RBC 01/29/2018 3.59* 3.87 - 5.11 MIL/uL Final  . Hemoglobin 01/29/2018 10.9* 12.0 - 15.0 g/dL Final  . HCT 01/29/2018 33.1* 36.0 - 46.0 % Final  . MCV 01/29/2018 92.2  78.0 - 100.0 fL Final  . MCH 01/29/2018 30.4  26.0 - 34.0 pg Final  . MCHC 01/29/2018 32.9  30.0 - 36.0 g/dL Final  . RDW 01/29/2018 13.3  11.5 - 15.5 % Final  . Platelets 01/29/2018 255  150 - 400 K/uL Final   Performed at [facility], [place] [address], [place], [place] [address]  . Sodium 01/29/2018 141  135 - 145 mmol/L Final  . Potassium 01/29/2018 3.8  3.5 - 5.1 mmol/L Final  . Chloride 01/29/2018 109  101 - 111 mmol/L Final  . CO2 01/29/2018 25  22 - 32 mmol/L Final  . Glucose, Bld 01/29/2018 145* 65 - 99 mg/dL Final  . BUN 01/29/2018 10  6 - 20 mg/dL Final  . Creatinine, Ser 01/29/2018 0.70  0.44 - 1.00 mg/dL Final  . Calcium 01/29/2018 8.3* 8.9 - 10.3 mg/dL Final  . GFR calc non Af Amer 01/29/2018 >60  >60 mL/min Final  . GFR calc Af Amer 01/29/2018 >60  >60 mL/min Final   Comment: (NOTE) The eGFR has been calculated using the CKD EPI equation. This calculation has not been validated in all clinical situations. eGFR's persistently <60 mL/min signify possible Chronic Kidney Disease.   Georgiann Hahn gap 01/29/2018 7  5 - 15 Final   Performed at [facility], [place] [address], [place], [place] [address]  Hospital Outpatient Visit on 01/21/2018  Component  Date Value Ref Range Status  . aPTT 01/21/2018 29  24 - 36 seconds Final   Performed at [facility], [place] [address], [place], [place] [address]  . Prothrombin Time 01/21/2018 12.1  11.4 - 15.2 seconds Final  . INR 01/21/2018 0.90   Final   Performed at [facility] [address] [place], [place] [address]  . ABO/RH(D) 01/21/2018 O POS   Final  . Antibody Screen 01/21/2018 NEG   Final  . Sample Expiration 01/21/2018 01/31/2018   Final  . Extend sample reason 01/21/2018    Final                   Value:NO TRANSFUSIONS OR PREGNANCY IN THE PAST 3 MONTHS Performed at [facility], [place] [address] [place], [place] [address]   . Total Protein 01/21/2018 7.4  6.5 - 8.1 g/dL Final  . Albumin 01/21/2018 4.0  3.5 - 5.0 g/dL Final  . AST 01/21/2018 19  15 - 41 U/L Final  . ALT 01/21/2018 19  14 - 54 U/L Final  . Alkaline Phosphatase 01/21/2018 64  38 - 126 U/L Final  . Total Bilirubin 01/21/2018 0.4  0.3 - 1.2 mg/dL Final  . Bilirubin, Direct 01/21/2018 0.1  0.1 - 0.5 mg/dL Final  . Indirect Bilirubin 01/21/2018 0.3  0.3 - 0.9 mg/dL Final   Performed at [facility], [place] [address], [place], [place] [address]  . MRSA, PCR 01/21/2018 NEGATIVE  NEGATIVE Final  . Staphylococcus aureus 01/21/2018 NEGATIVE  NEGATIVE Final   Comment: (NOTE) The Xpert SA Assay (FDA approved for NASAL specimens in patients 37 years of age and older), is one component of a comprehensive surveillance program. It is not intended to diagnose infection nor to guide or monitor treatment. Performed at [facility], [place] [address], [place], [place] [address]      X-Rays:Dg Esophagus  Result Date: 01/19/2018 CLINICAL DATA:  Dysphagia EXAM: ESOPHOGRAM / BARIUM SWALLOW / BARIUM TABLET STUDY TECHNIQUE: Combined double contrast and single contrast examination performed using effervescent crystals, thick barium liquid, and thin  barium liquid. The patient was observed with fluoroscopy swallowing a 13 mm barium sulphate tablet. FLUOROSCOPY TIME:  Fluoroscopy Time:  1 minutes 6 seconds Radiation Exposure Index (if provided by the fluoroscopic device): Number of Acquired Spot Images: 0 COMPARISON:  None. FINDINGS: Mild decrease in esophageal motility. Small hiatal hernia. Mild stricture at the GE junction. Barium tablet was delayed approximately 10 seconds this area before passing into the stomach. Moderate gastroesophageal reflux most prominent in the RPO position while drinking water. IMPRESSION: Small hiatal hernia with moderate gastroesophageal stricture. Barium tablet passed through this area after a mild delay Moderate gastroesophageal reflux. Electronically Signed   By: Juanda Crumble  Carlis Abbott M.D.   On: 01/19/2018 09:43   Dg Pelvis Portable  Result Date: 01/28/2018 CLINICAL DATA:  Status post left hip arthroplasty. EXAM: PORTABLE PELVIS 1-2 VIEWS COMPARISON:  Radiograph of October 23, 2016. FINDINGS: Interval placement of left total hip arthroplasty. The acetabular and femoral components appear to be well situated. Surgical drain is seen in the adjacent soft tissues. Previously placed right hip arthroplasty is again noted as well. IMPRESSION: Status post left total hip arthroplasty. Electronically Signed   By: Marijo Conception, M.D.   On: 01/28/2018 13:53   Dg C-arm 1-60 Min-no Report  Result Date: 01/28/2018 Fluoroscopy was utilized by the requesting physician.  No radiographic interpretation.    EKG: Orders placed or performed during the hospital encounter of 05/07/12  . EKG     Hospital Course: Patient was admitted to [facility] and taken to the OR and underwent the above state procedure without complications.  Patient tolerated the procedure well and was later transferred to the recovery room and then to the orthopaedic floor for postoperative care.  They were given PO and IV analgesics for pain control following  their surgery.  They were given 24 hours of postoperative antibiotics of  Anti-infectives (From admission, onward)   Start     Dose/Rate Route Frequency Ordered Stop   01/28/18 1630  ceFAZolin (ANCEF) IVPB 1 g/50 mL premix     1 g 100 mL/hr over 30 Minutes Intravenous Every 6 hours 01/28/18 1428 01/28/18 2236   01/28/18 0820  ceFAZolin (ANCEF) 2-4 GM/100ML-% IVPB    Note to Pharmacy:  Randa Evens  : cabinet override      01/28/18 0820 01/28/18 0958   01/28/18 0818  ceFAZolin (ANCEF) IVPB 2g/100 mL premix     2 g 200 mL/hr over 30 Minutes Intravenous On call to O.R. 01/28/18 2863 01/28/18 8177     and started on DVT prophylaxis in the form of Xarelto.   PT and OT were ordered for total hip protocol.  The patient was allowed to be WBAT with therapy. Discharge planning was consulted to help with postop disposition and equipment needs.  Patient had a good night on the evening of surgery. Had some issues with nausea and vomiting initially but it resolved. They started to get up OOB with therapy on day one.  Hemovac drain was pulled without difficulty.  Tthe patient had progressed with therapy and meeting their goals.  Incision was healing well.  Patient was seen in rounds and was ready to go home.  Diet: Cardiac diet Activity:WBAT Follow-up:in 2 weeks Disposition - Home Discharged Condition: stable   Discharge Instructions    Call MD / Call 911   Complete by:  As directed    If you experience chest pain or shortness of breath, CALL 911 and be transported to the hospital emergency room.  If you develope a fever above 101 F, pus (white drainage) or increased drainage or redness at the wound, or calf pain, call your surgeon's office.   Constipation Prevention   Complete by:  As directed    Drink plenty of fluids.  Prune juice may be helpful.  You may use a stool softener, such as Colace (over the counter) 100 mg twice a day.  Use MiraLax (over the counter) for constipation as needed.    Diet - low sodium heart healthy   Complete by:  As directed    Discharge instructions   Complete by:  As directed    Dr.  Pilar Plate Aluisio Total Joint Specialist [facility] [address] [place], [place] [address] [phone]  ANTERIOR APPROACH TOTAL HIP REPLACEMENT POSTOPERATIVE DIRECTIONS   Hip Rehabilitation, Guidelines Following Surgery  The results of a hip operation are greatly improved after range of motion and muscle strengthening exercises. Follow all safety measures which are given to protect your hip. If any of these exercises cause increased pain or swelling in your joint, decrease the amount until you are comfortable again. Then slowly increase the exercises. Call your caregiver if you have problems or questions.   HOME CARE INSTRUCTIONS  Remove items at home which could result in a fall. This includes throw rugs or furniture in walking pathways.  ICE to the affected hip every three hours for 30 minutes at a time and then as needed for pain and swelling.  Continue to use ice on the hip for pain and swelling from surgery. You may notice swelling that will progress down to the foot and ankle.  This is normal after surgery.  Elevate the leg when you are not up walking on it.   Continue to use the breathing machine which will help keep your temperature down.  It is common for your temperature to cycle up and down following surgery, especially at night when you are not up moving around and exerting yourself.  The breathing machine keeps your lungs expanded and your temperature down.   DIET You may resume your previous home diet once your are discharged from the hospital.  DRESSING / WOUND CARE / SHOWERING You may change your dressing every day with sterile gauze.  Please use good hand washing techniques before changing the dressing.  Do not use any lotions or creams on the incision until instructed by your surgeon. You may start showering once you are discharged home  but do not submerge the incision under water. Just pat the incision dry and apply a dry gauze dressing on daily. Change the surgical dressing daily and reapply a dry dressing each time.  ACTIVITY Walk with your walker as instructed. Use walker as long as suggested by your caregivers. Avoid periods of inactivity such as sitting longer than an hour when not asleep. This helps prevent blood clots.  You may resume a sexual relationship in one month or when given the OK by your doctor.  You may return to work once you are cleared by your doctor.  Do not drive a car for 6 weeks or until released by you surgeon.  Do not drive while taking narcotics.  WEIGHT BEARING Weight bearing as tolerated with assist device (walker, cane, etc) as directed, use it as long as suggested by your surgeon or therapist, typically at least 4-6 weeks.  POSTOPERATIVE CONSTIPATION PROTOCOL Constipation - defined medically as fewer than three stools per week and severe constipation as less than one stool per week.  One of the most common issues patients have following surgery is constipation.  Even if you have a regular bowel pattern at home, your normal regimen is likely to be disrupted due to multiple reasons following surgery.  Combination of anesthesia, postoperative narcotics, change in appetite and fluid intake all can affect your bowels.  In order to avoid complications following surgery, here are some recommendations in order to help you during your recovery period.  Colace (docusate) - Pick up an over-the-counter form of Colace or another stool softener and take twice a day as long as you are requiring postoperative pain medications.  Take with  a full glass of water daily.  If you experience loose stools or diarrhea, hold the colace until you stool forms back up.  If your symptoms do not get better within 1 week or if they get worse, check with your doctor.  Dulcolax (bisacodyl) - Pick up over-the-counter and take as  directed by the product packaging as needed to assist with the movement of your bowels.  Take with a full glass of water.  Use this product as needed if not relieved by Colace only.   MiraLax (polyethylene glycol) - Pick up over-the-counter to have on hand.  MiraLax is a solution that will increase the amount of water in your bowels to assist with bowel movements.  Take as directed and can mix with a glass of water, juice, soda, coffee, or tea.  Take if you go more than two days without a movement. Do not use MiraLax more than once per day. Call your doctor if you are still constipated or irregular after using this medication for 7 days in a row.  If you continue to have problems with postoperative constipation, please contact the office for further assistance and recommendations.  If you experience "the worst abdominal pain ever" or develop nausea or vomiting, please contact the office immediatly for further recommendations for treatment.  ITCHING  If you experience itching with your medications, try taking only a single pain pill, or even half a pain pill at a time.  You can also use Benadryl over the counter for itching or also to help with sleep.   TED HOSE STOCKINGS Wear the elastic stockings on both legs for three weeks following surgery during the day but you may remove then at night for sleeping.  MEDICATIONS See your medication summary on the "After Visit Summary" that the nursing staff will review with you prior to discharge.  You may have some home medications which will be placed on hold until you complete the course of blood thinner medication.  It is important for you to complete the blood thinner medication as prescribed by your surgeon.  Continue your approved medications as instructed at time of discharge.      PRECAUTIONS If you experience chest pain or shortness of breath - call 911 immediately for transfer to the hospital emergency department.  If you develop a fever greater  that 101 F, purulent drainage from wound, increased redness or drainage from wound, foul odor from the wound/dressing, or calf pain - CONTACT YOUR SURGEON.                                                   FOLLOW-UP APPOINTMENTS Make sure you keep all of your appointments after your operation with your surgeon and caregivers. You should call the office at the above phone number and make an appointment for approximately two weeks after the date of your surgery or on the date instructed by your surgeon outlined in the "After Visit Summary".  RANGE OF MOTION AND STRENGTHENING EXERCISES  These exercises are designed to help you keep full movement of your hip joint. Follow your caregiver's or physical therapist's instructions. Perform all exercises about fifteen times, three times per day or as directed. Exercise both hips, even if you have had only one joint replacement. These exercises can be done on a training (exercise) mat, on the floor, on  a table or on a bed. Use whatever works the best and is most comfortable for you. Use music or television while you are exercising so that the exercises are a pleasant break in your day. This will make your life better with the exercises acting as a break in routine you can look forward to.  Lying on your back, slowly slide your foot toward your buttocks, raising your knee up off the floor. Then slowly slide your foot back down until your leg is straight again.  Lying on your back spread your legs as far apart as you can without causing discomfort.  Lying on your side, raise your upper leg and foot straight up from the floor as far as is comfortable. Slowly lower the leg and repeat.  Lying on your back, tighten up the muscle in the front of your thigh (quadriceps muscles). You can do this by keeping your leg straight and trying to raise your heel off the floor. This helps strengthen the largest muscle supporting your knee.  Lying on your back, tighten up the muscles of  your buttocks both with the legs straight and with the knee bent at a comfortable angle while keeping your heel on the floor.   IF YOU ARE TRANSFERRED TO A SKILLED REHAB FACILITY If the patient is transferred to a skilled rehab facility following release from the hospital, a list of the current medications will be sent to the facility for the patient to continue.  When discharged from the skilled rehab facility, please have the facility set up the patient's [place] prior to being released. Also, the skilled facility will be responsible for providing the patient with their medications at time of release from the facility to include their pain medication, the muscle relaxants, and their blood thinner medication. If the patient is still at the rehab facility at time of the two week follow up appointment, the skilled rehab facility will also need to assist the patient in arranging follow up appointment in our office and any transportation needs.  MAKE SURE YOU:  Understand these instructions.  Get help right away if you are not doing well or get worse.    Pick up stool softner and laxative for home use following surgery while on pain medications. Do not submerge incision under water. Please use good hand washing techniques while changing dressing each day. May shower starting three days after surgery. Please use a clean towel to pat the incision dry following showers. Continue to use ice for pain and swelling after surgery. Do not use any lotions or creams on the incision until instructed by your surgeon.   Increase activity slowly as tolerated   Complete by:  As directed    TED hose   Complete by:  As directed    Use stockings (TED hose) for 3 weeks on operative leg(s).  You may remove them at night for sleeping.     Allergies as of 01/29/2018      Reactions   Epinephrine Shortness Of Breath   Codeine Nausea And Vomiting      Xarelto [rivaroxaban] Rash      Medication  List    STOP taking these medications   ibuprofen 200 MG tablet Commonly known as:  ADVIL,MOTRIN     TAKE these medications   acetaminophen 500 MG tablet Commonly known as:  TYLENOL Take 1,000 mg by mouth daily as needed for moderate pain or headache. What changed:  Another medication with the same  name was removed. Continue taking this medication, and follow the directions you see here.   aspirin 325 MG EC tablet Take 1 tablet (325 mg total) by mouth daily with breakfast.   B-12 PO Take 1 tablet by mouth every other day.   cetirizine 10 MG tablet Commonly known as:  ZYRTEC Take 10 mg by mouth daily as needed for allergies.   conjugated estrogens vaginal cream Commonly known as:  PREMARIN Place 1 Applicatorful vaginally 2 (two) times a week.   FISH OIL ULTRA 1400 MG Caps Take 2,800 mg by mouth daily.   FOLIC ACID PO Take 1 capsule by mouth daily.   HYDROcodone-acetaminophen 7.5-325 MG tablet Commonly known as:  NORCO Take 1 tablet by mouth every 4 (four) hours as needed for severe pain (pain score 7-10).   Magnesium 500 MG Caps Take 500 mg by mouth daily.   meloxicam 15 MG tablet Commonly known as:  MOBIC Take 15 mg by mouth daily as needed for pain.   methocarbamol 500 MG tablet Commonly known as:  ROBAXIN Take 1 tablet (500 mg total) by mouth every 6 (six) hours as needed for muscle spasms.   traMADol 50 MG tablet Commonly known as:  ULTRAM Take 1-2 tablets (50-100 mg total) by mouth every 6 (six) hours as needed for moderate pain (not relieved with Norco).   ZANTAC PO Take 1 tablet by mouth daily as needed (acid reflux).      Follow-up Information    Gaynelle Arabian, MD. Schedule an appointment as soon as possible for a visit on 02/12/2018.   Specialty:  Orthopedic Surgery Contact information: [address] [place] [address] [phone]        [facility] Follow up.   Specialty:  [place] Why:  physical  therapy Contact information: [address] [place] [address] [phone]           Signed: Ardeen Jourdain, PA-C Orthopaedic Surgery 01/29/2018, 3:17 PM

## 2018-01-29 NOTE — Progress Notes (Signed)
Discharge planning, spoke with patient at bedside. Have chosen [facility] for HH PT, evaluate and treat. Contacted [facility] for referral. Has RW and 3n1. [phone] 

## 2018-02-02 ENCOUNTER — Other Ambulatory Visit: Payer: Self-pay

## 2018-02-02 ENCOUNTER — Emergency Department (HOSPITAL_BASED_OUTPATIENT_CLINIC_OR_DEPARTMENT_OTHER)
Admit: 2018-02-02 | Discharge: 2018-02-02 | Disposition: A | Discharge status: Home / Self Care | Payer: Commercial Managed Care - PPO | Attending: Emergency Medicine | Admitting: Emergency Medicine

## 2018-02-02 ENCOUNTER — Encounter (HOSPITAL_COMMUNITY): Payer: Self-pay | Admitting: Emergency Medicine

## 2018-02-02 ENCOUNTER — Emergency Department (HOSPITAL_COMMUNITY)
Admission: EM | Admit: 2018-02-02 | Discharge: 2018-02-02 | Disposition: A | Discharge status: Home / Self Care | Payer: Commercial Managed Care - PPO | Attending: Emergency Medicine | Admitting: Emergency Medicine

## 2018-02-02 DIAGNOSIS — M7989 Other specified soft tissue disorders: Secondary | ICD-10-CM | POA: Diagnosis not present

## 2018-02-02 DIAGNOSIS — Z96642 Presence of left artificial hip joint: Secondary | ICD-10-CM | POA: Insufficient documentation

## 2018-02-02 DIAGNOSIS — M79605 Pain in left leg: Secondary | ICD-10-CM | POA: Diagnosis present

## 2018-02-02 DIAGNOSIS — Z7982 Long term (current) use of aspirin: Secondary | ICD-10-CM | POA: Diagnosis not present

## 2018-02-02 DIAGNOSIS — L539 Erythematous condition, unspecified: Secondary | ICD-10-CM | POA: Diagnosis not present

## 2018-02-02 DIAGNOSIS — Z79899 Other long term (current) drug therapy: Secondary | ICD-10-CM | POA: Diagnosis not present

## 2018-02-02 NOTE — ED Provider Notes (Signed)
[facility] DEPT Provider Note   CSN: [id number] Arrival date & time: 02/02/18  0442     History   Chief Complaint Chief Complaint  Patient presents with  . Leg Pain    HPI [name] is a 57 y.o. female.  HPI Patient noted an area of swelling and slight redness on her inner thigh on the left earlier this morning.  She recently had hip replacement on the left, 6 days ago.  She reports she is not having increasing pain in the hip or redness around the wound.  No fevers no chills.  She is concerned for possible blood clot.  Lower leg is not having any increased swelling or pain.  Patient denies chest pain or shortness of breath.  No fever no cough.  She reports she does have constipation.  She does not have abdominal pain.  No urinary symptoms.  She reports that she did start taking magnesium and MiraLAX as well as an enema yesterday.  This morning she did begin having some small amounts of bowel movement and believes the constipation is resolving. Past Medical History:  Diagnosis Date  . 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 Complication of anesthesia   . GERD (gastroesophageal reflux disease)   . OSA (obstructive sleep apnea)    cpap does not use since brain tumore rmoved breathes better  . Osteoarthritis   . Osteoarthritis   . PONV (postoperative nausea and vomiting)     Patient Active Problem List   Diagnosis Date Noted  . OA (osteoarthritis) of hip 10/23/2016  . Hyponatremia 05/07/2012  . Dehydration 05/07/2012  . Nausea & vomiting 05/07/2012  . UTI (lower urinary tract infection) 05/07/2012  . Acromegaly ([place]) 04/01/2012  . Pituitary adenoma-resected 2013 04/01/2012  . Obstructive sleep apnea 04/01/2012    Past Surgical History:  Procedure Laterality Date  . appendectomy  04/2008  . bladder suspension, unspecified  yrs ago  . burnionectomy, bilateral    . colonscopy  match 2019   endoscopy also  . elbow ligament  surgery right remote    . HEMORRHOID SURGERY  06/2007  . mesh removal due to an abcess      Bladder.  . nerve decompression right c5 and nerve ablation c5 and c6    . PITUITARY SURGERY  2013   UVa/ Pituitary adenoma w/ acromegaly  . rotator cuff repair right shoulder  11/2010  . TOTAL HIP ARTHROPLASTY Right 10/23/2016   Procedure: RIGHT TOTAL HIP ARTHROPLASTY ANTERIOR APPROACH;  Surgeon: Gaynelle Arabian, MD;  Location: [facility];  Service: Orthopedics;  Laterality: Right;  . TOTAL HIP ARTHROPLASTY Left 01/28/2018   Procedure: LEFT TOTAL HIP ARTHROPLASTY ANTERIOR APPROACH;  Surgeon: Gaynelle Arabian, MD;  Location: [facility];  Service: Orthopedics;  Laterality: Left;     OB History   None      Home Medications    Prior to Admission medications   Medication Sig Start Date End Date Taking? Authorizing Provider  acetaminophen (TYLENOL) 500 MG tablet Take 1,000 mg by mouth daily as needed for moderate pain or headache.    [provider]  aspirin EC 325 MG EC tablet Take 1 tablet (325 mg total) by mouth daily with breakfast. 01/29/18   Cecilio Asper, Amber, PA-C  cetirizine (ZYRTEC) 10 MG tablet Take 10 mg by mouth daily as needed for allergies.    [provider]  conjugated estrogens (PREMARIN) vaginal cream Place 1 Applicatorful vaginally 2 (two) times a week.  [provider]  Cyanocobalamin (B-12 PO) Take 1 tablet by mouth every other day.    [provider]  FOLIC ACID PO Take 1 capsule by mouth daily.    [provider]  HYDROcodone-acetaminophen (NORCO) 7.5-325 MG tablet Take 1 tablet by mouth every 4 (four) hours as needed for severe pain (pain score 7-10). 01/29/18   Cecilio Asper, Amber, PA-C  Magnesium 500 MG CAPS Take 500 mg by mouth daily.     [provider]  meloxicam (MOBIC) 15 MG tablet Take 15 mg by mouth daily as needed for pain.    [provider]  methocarbamol (ROBAXIN) 500 MG tablet Take 1 tablet (500 mg total) by mouth  every 6 (six) hours as needed for muscle spasms. 01/28/18   Constable, Amber, PA-C  Omega-3 Fatty Acids (FISH OIL ULTRA) 1400 MG CAPS Take 2,800 mg by mouth daily.    [provider]  raNITIdine HCl (ZANTAC PO) Take 1 tablet by mouth daily as needed (acid reflux).    [provider]  traMADol (ULTRAM) 50 MG tablet Take 1-2 tablets (50-100 mg total) by mouth every 6 (six) hours as needed for moderate pain (not relieved with Norco). 01/29/18   Ardeen Jourdain, PA-C    Family History Family History  Problem Relation Age of Onset  . Heart disease Father   . Coronary artery disease Father   . Heart disease Mother   . Coronary artery disease Mother   . Sleep apnea Sister     Social History Social History   Tobacco Use  . Smoking status: Never Smoker  . Smokeless tobacco: Never Used  Substance Use Topics  . Alcohol use: No  . Drug use: No     Allergies   Epinephrine; Codeine; and Xarelto [rivaroxaban]   Review of Systems Review of Systems 10 Systems reviewed and are negative for acute change except as noted in the HPI.   Physical Exam Updated Vital Signs BP 124/79 (BP Location: Right Arm)   Pulse 84   Temp 98.9 F (37.2 C) (Oral)   Resp 18   SpO2 98%   Physical Exam  Constitutional: She is oriented to person, place, and time. She appears well-developed and well-nourished. No distress.  HENT:  Head: Normocephalic and atraumatic.  Eyes: EOM are normal.  Cardiovascular: Normal rate, regular rhythm and normal heart sounds.  Pulmonary/Chest: Effort normal and breath sounds normal.  Abdominal: Soft. She exhibits no distension. There is no tenderness. There is no guarding.  Musculoskeletal:  Incision left lateral hip clean dry and intact.  No erythema.  Subtle, diffuse erythema medial left thigh.  Appears to be approximately 10 cm x 10 cm patch.  This is quite faint.  Nontender.  Not associated with surgical wound.  No streaking.  Left lower calf soft and  nontender.  Neurological: She is alert and oriented to person, place, and time. She exhibits normal muscle tone. Coordination normal.  Skin: Skin is warm and dry.  Psychiatric: She has a normal mood and affect.     ED Treatments / Results  Labs (all labs ordered are listed, but only abnormal results are displayed) Labs Reviewed - No data to display  EKG None  Radiology No results found.  Procedures Procedures (including critical care time)  Medications Ordered in ED Medications - No data to display   Initial Impression / Assessment and Plan / ED Course  I have reviewed the triage vital signs and the nursing notes.  Pertinent labs &  imaging results that were available during my care of the patient were reviewed by me and considered in my medical decision making (see chart for details).       Final Clinical Impressions(s) / ED Diagnoses   Final diagnoses:  Skin erythema  Status post left hip replacement   DVT study negative.  Patient has subtle area of skin change.  At this time appears low probability to be infection or cellulitis.  Hip wound appears to be in good condition.  Patient is not having fever, chills or increasing pain.  Area may be subtle contact dermatitis or rash or urticaria.  At this time, patient stable for discharge.  She is counseled on continue to observe the area and return with changes or additional symptoms.  She is advised to follow-up with her surgeon for recheck. ED Discharge Orders    None       Charlesetta Shanks, MD 02/02/18 [id number]

## 2018-02-02 NOTE — ED Triage Notes (Signed)
Pt had a left total hip replacement on  Wednesday and was discharged to home on Thursday  Dr Ricki Rodriguez did the surgery  Pt states tonight she has a raised red area on her left inner thigh that is tender  Pt concerned it may be a blood clot  Pt is also c/o constipation as she has not had a BM since her surgery

## 2018-02-02 NOTE — Discharge Instructions (Addendum)
1.  Your ultrasound did not show evidence of a blood clot.  Continue to monitor the area of swelling.  If you develop increasing redness, pain, fever or signs of infection return for recheck. 2.  Follow-up with your surgeon this week for recheck.

## 2018-02-02 NOTE — ED Notes (Signed)
ULTRASOUND AT BEDSIDE

## 2018-02-02 NOTE — Progress Notes (Signed)
Left lower extremity venous duplex has been completed. Negative for DVT. Results were given to Dr. Johnney Killian.  02/02/18 8:55 AM Carlos Levering RVT

## 2018-06-04 ENCOUNTER — Encounter: Payer: Self-pay | Admitting: Podiatry

## 2018-06-04 ENCOUNTER — Ambulatory Visit (INDEPENDENT_AMBULATORY_CARE_PROVIDER_SITE_OTHER): Payer: Commercial Managed Care - PPO

## 2018-06-04 ENCOUNTER — Ambulatory Visit: Payer: Commercial Managed Care - PPO | Admitting: Podiatry

## 2018-06-04 VITALS — BP 130/76 | HR 66 | Resp 16

## 2018-06-04 DIAGNOSIS — M205X1 Other deformities of toe(s) (acquired), right foot: Secondary | ICD-10-CM | POA: Diagnosis not present

## 2018-06-04 NOTE — Progress Notes (Signed)
Subjective:  Patient ID: [name], female    DOB: 1961/08/17,  MRN: [id number] HPI Chief Complaint  Patient presents with  . Foot Pain    1st MPJ right - knot dorsally x years, getting more painful, limited ROM and pain with certain shoes, did have couple injections from Dr. Gershon Mussel and also wanted a 2nd opinion  . New Patient (Initial Visit)    57 y.o. female presents with the above complaint.   ROS: Denies fever chills nausea vomiting muscle aches pains calf pain back pain chest pain shortness of breath.  Past Medical History:  Diagnosis Date  . 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 Complication of anesthesia   . GERD (gastroesophageal reflux disease)   . OSA (obstructive sleep apnea)    cpap does not use since brain tumore rmoved breathes better  . Osteoarthritis   . Osteoarthritis   . PONV (postoperative nausea and vomiting)    Past Surgical History:  Procedure Laterality Date  . appendectomy  04/2008  . bladder suspension, unspecified  yrs ago  . burnionectomy, bilateral    . colonscopy  match 2019   endoscopy also  . elbow ligament surgery right remote    . HEMORRHOID SURGERY  06/2007  . mesh removal due to an abcess      Bladder.  . nerve decompression right c5 and nerve ablation c5 and c6    . PITUITARY SURGERY  2013   UVa/ Pituitary adenoma w/ acromegaly  . rotator cuff repair right shoulder  11/2010  . TOTAL HIP ARTHROPLASTY Right 10/23/2016   Procedure: RIGHT TOTAL HIP ARTHROPLASTY ANTERIOR APPROACH;  Surgeon: Gaynelle Arabian, MD;  Location: [facility];  Service: Orthopedics;  Laterality: Right;  . TOTAL HIP ARTHROPLASTY Left 01/28/2018   Procedure: LEFT TOTAL HIP ARTHROPLASTY ANTERIOR APPROACH;  Surgeon: Gaynelle Arabian, MD;  Location: [facility];  Service: Orthopedics;  Laterality: Left;    Current Outpatient Medications:  .  acetaminophen (TYLENOL) 500 MG tablet, Take 1,000 mg by mouth daily as needed for moderate pain or headache., Disp: , Rfl:  .   aspirin EC 325 MG EC tablet, Take 1 tablet (325 mg total) by mouth daily with breakfast., Disp: 30 tablet, Rfl: 0 .  conjugated estrogens (PREMARIN) vaginal cream, Place 1 Applicatorful vaginally 2 (two) times a week., Disp: , Rfl:  .  Magnesium 500 MG CAPS, Take 500 mg by mouth daily. , Disp: , Rfl:  .  Omega-3 Fatty Acids (FISH OIL ULTRA) 1400 MG CAPS, Take 2,800 mg by mouth daily., Disp: , Rfl:  .  raNITIdine HCl (ZANTAC PO), Take 1 tablet by mouth daily as needed (acid reflux)., Disp: , Rfl:   Allergies  Allergen Reactions  . Epinephrine Shortness Of Breath  . Codeine Nausea And Vomiting       . Xarelto [Rivaroxaban] Rash   Review of Systems Objective:   Vitals:   06/04/18 1344  BP: 130/76  Pulse: 66  Resp: 16    General: Well developed, nourished, in no acute distress, alert and oriented x3   Dermatological: Skin is warm, dry and supple bilateral. Nails x 10 are well maintained; remaining integument appears unremarkable at this time. There are no open sores, no preulcerative lesions, no rash or signs of infection present.  Vascular: Dorsalis Pedis artery and Posterior Tibial artery pedal pulses are 2/4 bilateral with immedate capillary fill time. Pedal hair growth present. No varicosities and no lower extremity edema present bilateral.   Neruologic: Grossly intact  via light touch bilateral. Vibratory intact via tuning fork bilateral. Protective threshold with Semmes Wienstein monofilament intact to all pedal sites bilateral. Patellar and Achilles deep tendon reflexes 2+ bilateral. No Babinski or clonus noted bilateral.   Musculoskeletal: No gross boney pedal deformities bilateral. No pain, crepitus, or limitation noted with foot and ankle range of motion bilateral. Muscular strength 5/5 in all groups tested bilateral.  Gait: Unassisted, Nonantalgic.    Radiographs:  Radiographs taken today demonstrate a rectus first metatarsal phalangeal joint slightly elevated with dorsal  spurring.  Joint space appears to be maintained if anything slightly narrowed.  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  Plan: Discussed etiology pathology conservative surgical therapies at this point she would like to consider surgical intervention which should consist of a Keller arthroplasty with a single silicone implant we discussed the possibilities of osteotomy as well as internal fixation she understands this is amenable to it as well.  Is really going to depend on the condition of the cartilage.  We discussed the pros and cons of surgery we also discussed the possible postop complications which may include but are not limited to postop pain bleeding swelling infection recurrence need for further surgery overcorrection under correction loss of digit loss of limb loss of life.  We dispensed a Cam walker today paperwork regarding her preop instructions as well as the surgery center instructions and anesthesia group information.     [name] T. [place], [place]

## 2018-06-04 NOTE — Patient Instructions (Signed)
Pre-Operative Instructions  Congratulations, you have decided to take an important step towards improving your quality of life.  You can be assured that the doctors and staff at [facility] will be with you every step of the way.  Here are some important things you should know:  1. Plan to be at the surgery center/hospital at least 1 (one) hour prior to your scheduled time, unless otherwise directed by the surgical center/hospital staff.  You must have a responsible adult accompany you, remain during the surgery and drive you home.  Make sure you have directions to the surgical center/hospital to ensure you arrive on time. 2. If you are having surgery at [facility] or [facility], you will need a copy of your medical history and physical form from your family physician within one month prior to the date of surgery. We will give you a form for your primary physician to complete.  3. We make every effort to accommodate the date you request for surgery.  However, there are times where surgery dates or times have to be moved.  We will contact you as soon as possible if a change in schedule is required.   4. No aspirin/ibuprofen for one week before surgery.  If you are on aspirin, any non-steroidal anti-inflammatory medications (Mobic, Aleve, Ibuprofen) should not be taken seven (7) days prior to your surgery.  You make take Tylenol for pain prior to surgery.  5. Medications - If you are taking daily heart and blood pressure medications, seizure, reflux, allergy, asthma, anxiety, pain or diabetes medications, make sure you notify the surgery center/hospital before the day of surgery so they can tell you which medications you should take or avoid the day of surgery. 6. No food or drink after midnight the night before surgery unless directed otherwise by surgical center/hospital staff. 7. No alcoholic beverages 24-hours prior to surgery.  No smoking 24-hours prior or 24-hours after  surgery. 8. Wear loose pants or shorts. They should be loose enough to fit over bandages, boots, and casts. 9. Don't wear slip-on shoes. Sneakers are preferred. 10. Bring your boot with you to the surgery center/hospital.  Also bring crutches or a walker if your physician has prescribed it for you.  If you do not have this equipment, it will be provided for you after surgery. 11. If you have not been contacted by the surgery center/hospital by the day before your surgery, call to confirm the date and time of your surgery. 12. Leave-time from work may vary depending on the type of surgery you have.  Appropriate arrangements should be made prior to surgery with your employer. 13. Prescriptions will be provided immediately following surgery by your doctor.  Fill these as soon as possible after surgery and take the medication as directed. Pain medications will not be refilled on weekends and must be approved by the doctor. 14. Remove nail polish on the operative foot and avoid getting pedicures prior to surgery. 15. Wash the night before surgery.  The night before surgery wash the foot and leg well with water and the antibacterial soap provided. Be sure to pay special attention to beneath the toenails and in between the toes.  Wash for at least three (3) minutes. Rinse thoroughly with water and dry well with a towel.  Perform this wash unless told not to do so by your physician.  Enclosed: 1 Ice pack (please put in freezer the night before surgery)   1 Hibiclens skin cleaner     Pre-op instructions  If you have any questions regarding the instructions, please do not hesitate to call our office.  [place]: [address], [place], [place] [address] -- [phone]  [facility]: [address] [place], [place] [address] -- [phone]  [facility]: [address]  [place], [place] [address] -- [phone]  [place]: [address], [address], [place], [place] [address] -- [phone]  Website:  [email or web address] 

## 2018-06-30 ENCOUNTER — Telehealth: Payer: Self-pay | Admitting: *Deleted

## 2018-06-30 ENCOUNTER — Other Ambulatory Visit (HOSPITAL_COMMUNITY): Payer: Self-pay | Admitting: Orthopedic Surgery

## 2018-06-30 NOTE — Telephone Encounter (Signed)
"  I have a surgery scheduled with Dr. Milinda Pointer, I believe on November 1, which I need to cancel.  When I came in, they gave me a boot to bring home.  Do I need to return that boot?  I would be happy to do so."  I got your message  Do you want to reschedule.  "I don't want to reschedule it."  Is there a reason I can give Dr. Milinda Pointer for the cancellation?  "I have some unexpected traveling I have to do.  Should I return the boot?"  It's left up to you, if your are going to eventually reschedule the surgery you can hold on to it.  However, if you are not going to have the surgery, you can return the boot.  "I will return it."  I will cancel your surgery with the surgical center and I will let Dr. Milinda Pointer know.

## 2018-08-04 ENCOUNTER — Encounter (HOSPITAL_BASED_OUTPATIENT_CLINIC_OR_DEPARTMENT_OTHER): Payer: Self-pay | Admitting: *Deleted

## 2018-08-04 ENCOUNTER — Other Ambulatory Visit: Payer: Self-pay

## 2018-08-13 ENCOUNTER — Encounter (HOSPITAL_BASED_OUTPATIENT_CLINIC_OR_DEPARTMENT_OTHER): Payer: Self-pay | Admitting: Certified Registered"

## 2018-08-13 ENCOUNTER — Other Ambulatory Visit: Payer: Self-pay

## 2018-08-13 ENCOUNTER — Ambulatory Visit (HOSPITAL_BASED_OUTPATIENT_CLINIC_OR_DEPARTMENT_OTHER): Payer: Commercial Managed Care - PPO | Admitting: Anesthesiology

## 2018-08-13 ENCOUNTER — Ambulatory Visit (HOSPITAL_BASED_OUTPATIENT_CLINIC_OR_DEPARTMENT_OTHER)
Admission: RE | Admit: 2018-08-13 | Discharge: 2018-08-13 | Disposition: A | Discharge status: Home / Self Care | Payer: Commercial Managed Care - PPO | Source: Ambulatory Visit | Attending: Orthopedic Surgery | Admitting: Orthopedic Surgery

## 2018-08-13 ENCOUNTER — Encounter (HOSPITAL_BASED_OUTPATIENT_CLINIC_OR_DEPARTMENT_OTHER)
Admission: RE | Disposition: A | Discharge status: Home / Self Care | Payer: Self-pay | Source: Ambulatory Visit | Attending: Orthopedic Surgery

## 2018-08-13 DIAGNOSIS — M2021 Hallux rigidus, right foot: Secondary | ICD-10-CM | POA: Insufficient documentation

## 2018-08-13 DIAGNOSIS — M659 Synovitis and tenosynovitis, unspecified: Secondary | ICD-10-CM | POA: Diagnosis not present

## 2018-08-13 DIAGNOSIS — G4733 Obstructive sleep apnea (adult) (pediatric): Secondary | ICD-10-CM | POA: Insufficient documentation

## 2018-08-13 DIAGNOSIS — Z96643 Presence of artificial hip joint, bilateral: Secondary | ICD-10-CM | POA: Diagnosis not present

## 2018-08-13 DIAGNOSIS — K219 Gastro-esophageal reflux disease without esophagitis: Secondary | ICD-10-CM | POA: Insufficient documentation

## 2018-08-13 DIAGNOSIS — Z79899 Other long term (current) drug therapy: Secondary | ICD-10-CM | POA: Diagnosis not present

## 2018-08-13 DIAGNOSIS — I10 Essential (primary) hypertension: Secondary | ICD-10-CM | POA: Insufficient documentation

## 2018-08-13 DIAGNOSIS — Z79890 Hormone replacement therapy (postmenopausal): Secondary | ICD-10-CM | POA: Insufficient documentation

## 2018-08-13 DIAGNOSIS — M79671 Pain in right foot: Secondary | ICD-10-CM | POA: Diagnosis present

## 2018-08-13 DIAGNOSIS — M25774 Osteophyte, right foot: Secondary | ICD-10-CM | POA: Insufficient documentation

## 2018-08-13 DIAGNOSIS — Z7982 Long term (current) use of aspirin: Secondary | ICD-10-CM | POA: Insufficient documentation

## 2018-08-13 SURGERY — CHEILECTOMY, GREAT TOE, WITH IMPLANT INSERTION
Anesthesia: General | Site: Foot | Laterality: Right

## 2018-08-13 MED ORDER — MEPERIDINE HCL 25 MG/ML IJ SOLN: 6.2500 mg | INTRAMUSCULAR | Status: DC | PRN

## 2018-08-13 MED ORDER — CEFAZOLIN SODIUM-DEXTROSE 2-4 GM/100ML-% IV SOLN
2.0000 g | INTRAVENOUS | Status: AC
  Administered 2018-08-13: 2 g via INTRAVENOUS

## 2018-08-13 MED ORDER — SENNA 8.6 MG PO TABS: 2.0000 | ORAL_TABLET | Freq: Two times a day (BID) | ORAL | 0 refills | Status: DC

## 2018-08-13 MED ORDER — PROPOFOL 10 MG/ML IV BOLUS
INTRAVENOUS | Status: DC | PRN
  Administered 2018-08-13: 150 mg via INTRAVENOUS

## 2018-08-13 MED ORDER — CHLORHEXIDINE GLUCONATE 4 % EX LIQD: 60.0000 mL | Freq: Once | CUTANEOUS | Status: DC

## 2018-08-13 MED ORDER — OXYCODONE HCL 5 MG/5ML PO SOLN: 5.0000 mg | Freq: Once | ORAL | Status: DC | PRN

## 2018-08-13 MED ORDER — SCOPOLAMINE 1 MG/3DAYS TD PT72
MEDICATED_PATCH | TRANSDERMAL | Status: AC
  Filled 2018-08-13: qty 1

## 2018-08-13 MED ORDER — OXYCODONE HCL 5 MG PO TABS: 5.0000 mg | ORAL_TABLET | ORAL | 0 refills | Status: AC | PRN

## 2018-08-13 MED ORDER — MIDAZOLAM HCL 2 MG/2ML IJ SOLN
1.0000 mg | INTRAMUSCULAR | Status: DC | PRN
  Administered 2018-08-13: 2 mg via INTRAVENOUS

## 2018-08-13 MED ORDER — ROPIVACAINE HCL 7.5 MG/ML IJ SOLN
INTRAMUSCULAR | Status: DC | PRN
  Administered 2018-08-13: 25 mL via PERINEURAL

## 2018-08-13 MED ORDER — LIDOCAINE HCL (CARDIAC) PF 100 MG/5ML IV SOSY
PREFILLED_SYRINGE | INTRAVENOUS | Status: DC | PRN
  Administered 2018-08-13: 30 mg via INTRAVENOUS

## 2018-08-13 MED ORDER — FENTANYL CITRATE (PF) 100 MCG/2ML IJ SOLN
INTRAMUSCULAR | Status: AC
  Filled 2018-08-13: qty 2

## 2018-08-13 MED ORDER — MIDAZOLAM HCL 2 MG/2ML IJ SOLN
INTRAMUSCULAR | Status: AC
  Filled 2018-08-13: qty 2

## 2018-08-13 MED ORDER — ACETAMINOPHEN 325 MG PO TABS: 325.0000 mg | ORAL_TABLET | ORAL | Status: DC | PRN

## 2018-08-13 MED ORDER — FENTANYL CITRATE (PF) 100 MCG/2ML IJ SOLN
50.0000 ug | INTRAMUSCULAR | Status: DC | PRN
  Administered 2018-08-13: 100 ug via INTRAVENOUS

## 2018-08-13 MED ORDER — ONDANSETRON HCL 4 MG/2ML IJ SOLN: 4.0000 mg | Freq: Once | INTRAMUSCULAR | Status: DC | PRN

## 2018-08-13 MED ORDER — LACTATED RINGERS IV SOLN: INTRAVENOUS | Status: DC

## 2018-08-13 MED ORDER — FENTANYL CITRATE (PF) 100 MCG/2ML IJ SOLN: 25.0000 ug | INTRAMUSCULAR | Status: DC | PRN

## 2018-08-13 MED ORDER — ONDANSETRON HCL 4 MG/2ML IJ SOLN
INTRAMUSCULAR | Status: DC | PRN
  Administered 2018-08-13: 4 mg via INTRAVENOUS

## 2018-08-13 MED ORDER — PROPOFOL 500 MG/50ML IV EMUL
INTRAVENOUS | Status: DC | PRN
  Administered 2018-08-13: 150 ug/kg/min via INTRAVENOUS

## 2018-08-13 MED ORDER — SCOPOLAMINE 1 MG/3DAYS TD PT72
1.0000 | MEDICATED_PATCH | Freq: Once | TRANSDERMAL | Status: DC | PRN
  Administered 2018-08-13: 1.5 mg via TRANSDERMAL

## 2018-08-13 MED ORDER — ACETAMINOPHEN 160 MG/5ML PO SOLN: 325.0000 mg | ORAL | Status: DC | PRN

## 2018-08-13 MED ORDER — ONDANSETRON HCL 4 MG PO TABS: 4.0000 mg | ORAL_TABLET | Freq: Every day | ORAL | 0 refills | Status: DC | PRN

## 2018-08-13 MED ORDER — DOCUSATE SODIUM 100 MG PO CAPS: 100.0000 mg | ORAL_CAPSULE | Freq: Two times a day (BID) | ORAL | 0 refills | Status: DC

## 2018-08-13 MED ORDER — OXYCODONE HCL 5 MG PO TABS: 5.0000 mg | ORAL_TABLET | Freq: Once | ORAL | Status: DC | PRN

## 2018-08-13 MED ORDER — SODIUM CHLORIDE 0.9 % IV SOLN: INTRAVENOUS | Status: DC

## 2018-08-13 MED ORDER — CEFAZOLIN SODIUM-DEXTROSE 2-4 GM/100ML-% IV SOLN
INTRAVENOUS | Status: AC
  Filled 2018-08-13: qty 100

## 2018-08-13 SURGICAL SUPPLY — 57 items
BANDAGE ESMARK 6X9 LF (GAUZE/BANDAGES/DRESSINGS) IMPLANT
BLADE SURG 15 STRL LF DISP TIS (BLADE) ×2 IMPLANT
BLADE SURG 15 STRL SS (BLADE) ×2
BNDG COHESIVE 4X5 TAN STRL (GAUZE/BANDAGES/DRESSINGS) ×2 IMPLANT
BNDG CONFORM 2 STRL LF (GAUZE/BANDAGES/DRESSINGS) IMPLANT
BNDG CONFORM 3 STRL LF (GAUZE/BANDAGES/DRESSINGS) ×2 IMPLANT
BNDG ESMARK 4X9 LF (GAUZE/BANDAGES/DRESSINGS) IMPLANT
BNDG ESMARK 6X9 LF (GAUZE/BANDAGES/DRESSINGS)
CHLORAPREP W/TINT 26ML (MISCELLANEOUS) ×2 IMPLANT
COVER BACK TABLE 60X90IN (DRAPES) ×2 IMPLANT
COVER WAND RF STERILE (DRAPES) IMPLANT
CUFF TOURNIQUET SINGLE 34IN LL (TOURNIQUET CUFF) IMPLANT
DRAPE EXTREMITY T 121X128X90 (DRAPE) ×2 IMPLANT
DRAPE OEC MINIVIEW 54X84 (DRAPES) IMPLANT
DRAPE SURG 17X23 STRL (DRAPES) IMPLANT
DRAPE U-SHAPE 47X51 STRL (DRAPES) ×2 IMPLANT
DRSG MEPITEL 4X7.2 (GAUZE/BANDAGES/DRESSINGS) ×2 IMPLANT
DRSG PAD ABDOMINAL 8X10 ST (GAUZE/BANDAGES/DRESSINGS) ×2 IMPLANT
ELECT REM PT RETURN 9FT ADLT (ELECTROSURGICAL) ×2
ELECTRODE REM PT RTRN 9FT ADLT (ELECTROSURGICAL) ×1 IMPLANT
GAUZE SPONGE 4X4 12PLY STRL (GAUZE/BANDAGES/DRESSINGS) ×2 IMPLANT
GLOVE BIO SURGEON STRL SZ 6.5 (GLOVE) ×2 IMPLANT
GLOVE BIO SURGEON STRL SZ8 (GLOVE) ×2 IMPLANT
GLOVE BIOGEL PI IND STRL 7.0 (GLOVE) ×2 IMPLANT
GLOVE BIOGEL PI IND STRL 8 (GLOVE) ×2 IMPLANT
GLOVE BIOGEL PI INDICATOR 7.0 (GLOVE) ×2
GLOVE BIOGEL PI INDICATOR 8 (GLOVE) ×2
GLOVE ECLIPSE 8.0 STRL XLNG CF (GLOVE) ×2 IMPLANT
GOWN STRL REUS W/ TWL LRG LVL3 (GOWN DISPOSABLE) ×1 IMPLANT
GOWN STRL REUS W/ TWL XL LVL3 (GOWN DISPOSABLE) ×2 IMPLANT
GOWN STRL REUS W/TWL LRG LVL3 (GOWN DISPOSABLE) ×1
GOWN STRL REUS W/TWL XL LVL3 (GOWN DISPOSABLE) ×2
IMPL MTP CARTIVA 10MM (Orthopedic Implant) ×1 IMPLANT
IMPLANT MTP CARTIVA 10MM (Orthopedic Implant) ×2 IMPLANT
NEEDLE HYPO 25X1 1.5 SAFETY (NEEDLE) IMPLANT
NS IRRIG 1000ML POUR BTL (IV SOLUTION) ×2 IMPLANT
PACK BASIN DAY SURGERY FS (CUSTOM PROCEDURE TRAY) ×2 IMPLANT
PAD CAST 4YDX4 CTTN HI CHSV (CAST SUPPLIES) ×1 IMPLANT
PADDING CAST COTTON 4X4 STRL (CAST SUPPLIES) ×1
PENCIL BUTTON HOLSTER BLD 10FT (ELECTRODE) ×2 IMPLANT
SANITIZER HAND PURELL 535ML FO (MISCELLANEOUS) ×2 IMPLANT
SHEET MEDIUM DRAPE 40X70 STRL (DRAPES) ×2 IMPLANT
SLEEVE SCD COMPRESS KNEE MED (MISCELLANEOUS) IMPLANT
SPONGE LAP 18X18 RF (DISPOSABLE) ×2 IMPLANT
STOCKINETTE 6  STRL (DRAPES) ×1
STOCKINETTE 6 STRL (DRAPES) ×1 IMPLANT
SUCTION FRAZIER HANDLE 10FR (MISCELLANEOUS) ×1
SUCTION TUBE FRAZIER 10FR DISP (MISCELLANEOUS) ×1 IMPLANT
SUT ETHILON 3 0 PS 1 (SUTURE) ×2 IMPLANT
SUT MNCRL AB 3-0 PS2 18 (SUTURE) ×2 IMPLANT
SUT VIC AB 2-0 SH 27 (SUTURE) ×1
SUT VIC AB 2-0 SH 27XBRD (SUTURE) ×1 IMPLANT
SYR BULB 3OZ (MISCELLANEOUS) ×2 IMPLANT
SYR CONTROL 10ML LL (SYRINGE) IMPLANT
TOWEL GREEN STERILE FF (TOWEL DISPOSABLE) ×2 IMPLANT
TUBE CONNECTING 20X1/4 (TUBING) ×2 IMPLANT
UNDERPAD 30X30 (UNDERPADS AND DIAPERS) ×2 IMPLANT

## 2018-08-13 NOTE — Anesthesia Preprocedure Evaluation (Signed)
Anesthesia Evaluation  Patient identified by MRN, date of birth, ID band Patient awake    Reviewed: Allergy & Precautions, H&P , NPO status , Patient's Chart, lab work & pertinent test results, reviewed documented beta blocker date and time   History of Anesthesia Complications (+) PONV and history of anesthetic complications  Airway Mallampati: II  TM Distance: >3 FB Neck ROM: full    Dental no notable dental hx.    Pulmonary sleep apnea and Continuous Positive Airway Pressure Ventilation ,    Pulmonary exam normal breath sounds clear to auscultation       Cardiovascular Exercise Tolerance: Good hypertension, Pt. on medications  Rhythm:regular Rate:Normal     Neuro/Psych Pituitary mass negative neurological ROS  negative psych ROS   GI/Hepatic Neg liver ROS, GERD  Medicated,  Endo/Other  negative endocrine ROS  Renal/GU negative Renal ROS  negative genitourinary   Musculoskeletal  (+) Arthritis , Osteoarthritis,  History of acromegaly   Abdominal   Peds  Hematology negative hematology ROS (+)   Anesthesia Other Findings   Reproductive/Obstetrics negative OB ROS                             Anesthesia Physical Anesthesia Plan  ASA: III  Anesthesia Plan: General   Post-op Pain Management: GA combined w/ Regional for post-op pain   Induction: Intravenous  PONV Risk Score and Plan: 4 or greater and Ondansetron, Dexamethasone, Treatment may vary due to age or medical condition and Midazolam  Airway Management Planned: LMA  Additional Equipment:   Intra-op Plan:   Post-operative Plan:   Informed Consent: I have reviewed the patients History and Physical, chart, labs and discussed the procedure including the risks, benefits and alternatives for the proposed anesthesia with the patient or authorized representative who has indicated his/her understanding and acceptance.   Dental  Advisory Given  Plan Discussed with: CRNA, Anesthesiologist and Surgeon  Anesthesia Plan Comments: ( )        Anesthesia Quick Evaluation

## 2018-08-13 NOTE — Op Note (Signed)
08/13/2018  9:13 AM  PATIENT:  [name]  57 y.o. female  PRE-OPERATIVE DIAGNOSIS:  right hallux rigidus  POST-OPERATIVE DIAGNOSIS:  right hallux rigidus  Procedure(s): Right hallux metatarsal phalangeal joint cheilectomy and joint resurfacing (10 mm Cartiva)  SURGEON:  Wylene Simmer, MD  ASSISTANT: Mechele Claude, PA-C  ANESTHESIA:   General, regional  EBL:  minimal   TOURNIQUET:   Total Tourniquet Time Documented: Thigh (Right) - 16 minutes Total: Thigh (Right) - 16 minutes  COMPLICATIONS:  None apparent  DISPOSITION:  Extubated, awake and stable to recovery.  INDICATION FOR PROCEDURE: The patient is a 57 year old female with a history of right forefoot pain due to hallux rigidus.  She has failed nonoperative treatment to date including activity modification, oral anti-inflammatories and shoewear modification.  She presents now for operative treatment of this painful and limiting condition.  The risks and benefits of the alternative treatment options have been discussed in detail.  The patient wishes to proceed with surgery and specifically understands risks of bleeding, infection, nerve damage, blood clots, need for additional surgery, amputation and death.  PROCEDURE IN DETAIL:    After pre operative consent was obtained, and the correct operative site was identified, the patient was brought to the operating room and placed supine on the OR table.  Anesthesia was administered.  Pre-operative antibiotics were administered.  A surgical timeout was taken.  The right lower extremity was prepped and draped in standard sterile fashion with a tourniquet around the thigh.  The extremity was elevated and the tourniquet was inflated to 250 mmHg.  A longitudinal incision was made over the hallux MP joint.  Dissection was carried down through the subcutaneous tissues.  The extensor houses longus and brevis tendons were identified.  They were mobilized and retracted laterally and  protected throughout the case.  The dorsal joint capsule was incised and elevated medially and laterally.  Immediately evident was significant synovitis and dorsal osteophytes.  The osteophytes were removed with a rondure.  Synovitis was sharply excised as well.  The wound was irrigated.  The metatarsal head was exposed and a K wire was inserted just dorsal from the midpoint in the head.  A reamer was then advanced over the K wire creating a socket for a 10 mm implant.  All of the bone debris was cleared away and the wound was irrigated copiously after removing the guidewire.  A 10 mm Cartiva implant was inserted and seated appropriately.  The joint was reduced and was noted to have approximately 60 degrees of dorsiflexion and 45 degrees of plantarflexion.  The wound was again irrigated.  The joint capsule was repaired with 2-0 Vicryl.  Subcutaneous tissues were repaired with 3-0 Monocryl.  The skin incision was closed with 3-0 nylon.  Sterile dressings were applied followed by a compression wrap.  The tourniquet was released after application of the dressings.  The patient was awakened from anesthesia and transported to the recovery room in stable condition.  FOLLOW UP PLAN: Weightbearing as tolerated in a flat postop shoe.  Follow-up in the office in 2 weeks for suture removal and to initiate active and passive range of motion    Mechele Claude PA-C was present and scrubbed for the duration of the operative case. His assistance was essential in positioning the patient, prepping and draping, gaining and maintaining exposure, performing the operation, closing and dressing the wounds and applying the splint.

## 2018-08-13 NOTE — H&P (Signed)
[name] is an 57 y.o. female.   Chief Complaint: Right foot pain HPI: Patient is a 57 year old female without significant past medical history.  She complains of pain at the right forefoot due to hallux rigidus.  She has failed nonoperative treatment to date including activity modification, oral anti-inflammatories, shoewear modification and injections.  She presents now for operative treatment of this painful and limiting condition.  Past Medical History:  Diagnosis Date  . 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 Complication of anesthesia   . GERD (gastroesophageal reflux disease)   . OSA (obstructive sleep apnea)    cpap does not use since brain tumore rmoved breathes better  . Osteoarthritis   . Osteoarthritis   . PONV (postoperative nausea and vomiting)     Past Surgical History:  Procedure Laterality Date  . appendectomy  04/2008  . bladder suspension, unspecified  yrs ago  . burnionectomy, bilateral    . colonscopy  match 2019   endoscopy also  . elbow ligament surgery right remote    . HEMORRHOID SURGERY  06/2007  . mesh removal due to an abcess      Bladder.  . nerve decompression right c5 and nerve ablation c5 and c6    . PITUITARY SURGERY  2013   UVa/ Pituitary adenoma w/ acromegaly  . rotator cuff repair right shoulder  11/2010  . TOTAL HIP ARTHROPLASTY Right 10/23/2016   Procedure: RIGHT TOTAL HIP ARTHROPLASTY ANTERIOR APPROACH;  Surgeon: Gaynelle Arabian, MD;  Location: [facility];  Service: Orthopedics;  Laterality: Right;  . TOTAL HIP ARTHROPLASTY Left 01/28/2018   Procedure: LEFT TOTAL HIP ARTHROPLASTY ANTERIOR APPROACH;  Surgeon: Gaynelle Arabian, MD;  Location: [facility];  Service: Orthopedics;  Laterality: Left;    Family History  Problem Relation Age of Onset  . Heart disease Father   . Coronary artery disease Father   . Heart disease Mother   . Coronary artery disease Mother   . Sleep apnea Sister    Social History:  reports that she has never  smoked. She has never used smokeless tobacco. She reports that she does not drink alcohol or use drugs.  Allergies:  Allergies  Allergen Reactions  . Epinephrine Shortness Of Breath  . Codeine Nausea And Vomiting       . Xarelto [Rivaroxaban] Rash    Medications Prior to Admission  Medication Sig Dispense Refill  . aspirin EC 325 MG EC tablet Take 1 tablet (325 mg total) by mouth daily with breakfast. 30 tablet 0  . conjugated estrogens (PREMARIN) vaginal cream Place 1 Applicatorful vaginally 2 (two) times a week.    . lactobacillus acidophilus (BACID) TABS tablet Take 2 tablets by mouth 3 (three) times daily.    . Magnesium 500 MG CAPS Take 500 mg by mouth daily.     . Omega-3 Fatty Acids (FISH OIL ULTRA) 1400 MG CAPS Take 2,800 mg by mouth daily.    . raNITIdine HCl (ZANTAC PO) Take 1 tablet by mouth daily as needed (acid reflux).    Marland Kitchen acetaminophen (TYLENOL) 500 MG tablet Take 1,000 mg by mouth daily as needed for moderate pain or headache.      No results found for this or any previous visit (from the past 48 hour(s)). No results found.  ROS no recent fever, chills, nausea, vomiting or changes in her appetite  Blood pressure (!) 127/92, pulse 97, temperature 98.5 F (36.9 C), temperature source Oral, resp. rate 14, height 5\' 2"  (1.575 m),  weight 76 kg, SpO2 99 %. Physical Exam  Well-nourished well-developed woman in no apparent distress.  Alert and oriented x4.  Mood and affect are normal.  Extraocular motions are intact.  Respirations are unlabored.  Gait is slightly antalgic to the right.  The right hallux has decreased range of motion at the MP joint.  There is a large dorsal osteophyte off metatarsal head that is tender to palpation.  Skin is otherwise healthy and intact.  No lymphadenopathy.  Pulses are palpable.  5 out of 5 strength in plantar flexion, dorsiflexion of the hallux MP joint.  Assessment/Plan Right hallux rigidus -to the operating room today for hallux MP  joint cheilectomy and resurfacing.  The risks and benefits of the alternative treatment options have been discussed in detail.  The patient wishes to proceed with surgery and specifically understands risks of bleeding, infection, nerve damage, blood clots, need for additional surgery, amputation and death.   Wylene Simmer, MD 2018-08-17, 8:11 AM

## 2018-08-13 NOTE — Anesthesia Procedure Notes (Signed)
Procedure Name: LMA Insertion Date/Time: 08/13/2018 8:38 AM Performed by: Signe Colt, CRNA Pre-anesthesia Checklist: Patient identified, Emergency Drugs available, Suction available and Patient being monitored Patient Re-evaluated:Patient Re-evaluated prior to induction Oxygen Delivery Method: Circle system utilized Preoxygenation: Pre-oxygenation with 100% oxygen Induction Type: IV induction Ventilation: Mask ventilation without difficulty LMA: LMA inserted LMA Size: 4.0 Number of attempts: 1 Airway Equipment and Method: Bite block Placement Confirmation: positive ETCO2 Tube secured with: Tape Dental Injury: Teeth and Oropharynx as per pre-operative assessment

## 2018-08-13 NOTE — Discharge Instructions (Addendum)
Jasmin Simmer, [name] [place]  Please read the following information regarding your care after surgery.  Medications  You only need a prescription for the narcotic pain medicine (ex. oxycodone, Percocet, Norco).  All of the other medicines listed below are available over the counter. X Aleve 2 pills twice a day for the first 3 days after surgery. X acetominophen (Tylenol) 650 mg every 4-6 hours as you need for minor to moderate pain X oxycodone as prescribed for severe pain  Narcotic pain medicine (ex. oxycodone, Percocet, Vicodin) will cause constipation.  To prevent this problem, take the following medicines while you are taking any pain medicine. X docusate sodium (Colace) 100 mg twice a day X senna (Senokot) 2 tablets twice a day     Weight Bearing X Bear weight only on your operated foot in the post-op shoe.   Cast / Splint / Dressing X Keep your splint, cast or dressing clean and dry.  Dont put anything (coat hanger, pencil, etc) down inside of it.  If it gets damp, use a hair dryer on the cool setting to dry it.  If it gets soaked, call the office to schedule an appointment for a cast change.   After your dressing, cast or splint is removed; you may shower, but do not soak or scrub the wound.  Allow the water to run over it, and then gently pat it dry.  Swelling It is normal for you to have swelling where you had surgery.  To reduce swelling and pain, keep your toes above your nose for at least 3 days after surgery.  It may be necessary to keep your foot or leg elevated for several weeks.  If it hurts, it should be elevated.  Follow Up Call my office at [phone] when you are discharged from the hospital or surgery center to schedule an appointment to be seen two weeks after surgery.  Call my office at [phone] if you develop a fever >101.5 F, nausea, vomiting, bleeding from the surgical site or severe pain.       Post Anesthesia Home Care  Instructions  Activity: Get plenty of rest for the remainder of the day. A responsible individual must stay with you for 24 hours following the procedure.  For the next 24 hours, DO NOT: -Drive a car -Paediatric nurse -Drink alcoholic beverages -Take any medication unless instructed by your physician -Make any legal decisions or sign important papers.  Meals: Start with liquid foods such as gelatin or soup. Progress to regular foods as tolerated. Avoid greasy, spicy, heavy foods. If nausea and/or vomiting occur, drink only clear liquids until the nausea and/or vomiting subsides. Call your physician if vomiting continues.  Special Instructions/Symptoms: Your throat may feel dry or sore from the anesthesia or the breathing tube placed in your throat during surgery. If this causes discomfort, gargle with warm salt water. The discomfort should disappear within 24 hours.  If you had a scopolamine patch placed behind your ear for the management of post- operative nausea and/or vomiting:  1. The medication in the patch is effective for 72 hours, after which it should be removed.  Wrap patch in a tissue and discard in the trash. Wash hands thoroughly with soap and water. 2. You may remove the patch earlier than 72 hours if you experience unpleasant side effects which may include dry mouth, dizziness or visual disturbances. 3. Avoid touching the patch. Wash your hands with soap and water after contact with the patch.  Regional Anesthesia Blocks  1. Numbness or the inability to move the "blocked" extremity may last from 3-48 hours after placement. The length of time depends on the medication injected and your individual response to the medication. If the numbness is not going away after 48 hours, call your surgeon.  2. The extremity that is blocked will need to be protected until the numbness is gone and the  Strength has returned. Because you cannot feel it, you will need to take extra care  to avoid injury. Because it may be weak, you may have difficulty moving it or using it. You may not know what position it is in without looking at it while the block is in effect.  3. For blocks in the legs and feet, returning to weight bearing and walking needs to be done carefully. You will need to wait until the numbness is entirely gone and the strength has returned. You should be able to move your leg and foot normally before you try and bear weight or walk. You will need someone to be with you when you first try to ensure you do not fall and possibly risk injury.  4. Bruising and tenderness at the needle site are common side effects and will resolve in a few days.  5. Persistent numbness or new problems with movement should be communicated to the surgeon or the [facility] [phone] [place] [phone]).  Post Anesthesia Home Care Instructions  Activity: Get plenty of rest for the remainder of the day. A responsible individual must stay with you for 24 hours following the procedure.  For the next 24 hours, DO NOT: -Drive a car -Paediatric nurse -Drink alcoholic beverages -Take any medication unless instructed by your physician -Make any legal decisions or sign important papers.  Meals: Start with liquid foods such as gelatin or soup. Progress to regular foods as tolerated. Avoid greasy, spicy, heavy foods. If nausea and/or vomiting occur, drink only clear liquids until the nausea and/or vomiting subsides. Call your physician if vomiting continues.  Special Instructions/Symptoms: Your throat may feel dry or sore from the anesthesia or the breathing tube placed in your throat during surgery. If this causes discomfort, gargle with warm salt water. The discomfort should disappear within 24 hours.  If you had a scopolamine patch placed behind your ear for the management of post- operative nausea and/or vomiting:  1. The medication in the patch is  effective for 72 hours, after which it should be removed.  Wrap patch in a tissue and discard in the trash. Wash hands thoroughly with soap and water. 2. You may remove the patch earlier than 72 hours if you experience unpleasant side effects which may include dry mouth, dizziness or visual disturbances. 3. Avoid touching the patch. Wash your hands with soap and water after contact with the patch.   Regional Anesthesia Blocks  1. Numbness or the inability to move the "blocked" extremity may last from 3-48 hours after placement. The length of time depends on the medication injected and your individual response to the medication. If the numbness is not going away after 48 hours, call your surgeon.  2. The extremity that is blocked will need to be protected until the numbness is gone and the  Strength has returned. Because you cannot feel it, you will need to take extra care to avoid injury. Because it may be weak, you may have difficulty moving it or using it. You may not know what position  it is in without looking at it while the block is in effect.  3. For blocks in the legs and feet, returning to weight bearing and walking needs to be done carefully. You will need to wait until the numbness is entirely gone and the strength has returned. You should be able to move your leg and foot normally before you try and bear weight or walk. You will need someone to be with you when you first try to ensure you do not fall and possibly risk injury.  4. Bruising and tenderness at the needle site are common side effects and will resolve in a few days.  5. Persistent numbness or new problems with movement should be communicated to the surgeon or the [facility] [phone] [place] [phone]).

## 2018-08-13 NOTE — Anesthesia Procedure Notes (Addendum)
Anesthesia Regional Block: Popliteal block   Pre-Anesthetic Checklist: ,, timeout performed, Correct Patient, Correct Site, Correct Laterality, Correct Procedure, Correct Position, site marked, Risks and benefits discussed,  Surgical consent,  Pre-op evaluation,  At surgeon's request and post-op pain management  Laterality: Right  Prep: chloraprep       Needles:  Injection technique: Single-shot  Needle Type: Echogenic Stimulator Needle     Needle Length: 5cm  Needle Gauge: 22     Additional Needles:   Procedures:, nerve stimulator,,, ultrasound used (permanent image in chart),,,,  Narrative:  Start time: 08/13/2018 8:15 AM End time: 08/13/2018 8:20 AM Injection made incrementally with aspirations every 5 mL.  Performed by: Personally  Anesthesiologist: Janeece Riggers, MD  Additional Notes: Functioning IV was confirmed and monitors were applied.  A 29mm 22ga Arrow echogenic stimulator needle was used. Sterile prep and drape,hand hygiene and sterile gloves were used. Ultrasound guidance: relevant anatomy identified, needle position confirmed, local anesthetic spread visualized around nerve(s)., vascular puncture avoided.  Image printed for medical record. Negative aspiration and negative test dose prior to incremental administration of local anesthetic. The patient tolerated the procedure well.

## 2018-08-13 NOTE — Anesthesia Postprocedure Evaluation (Signed)
Anesthesia Post Note  Patient: [name]  Procedure(s) Performed: Right hallux metatarsal phalangeal joint cheilectomy and joint resurfacing (Right Foot)     Patient location during evaluation: PACU Anesthesia Type: General Level of consciousness: awake and alert Pain management: pain level controlled Vital Signs Assessment: post-procedure vital signs reviewed and stable Respiratory status: spontaneous breathing, nonlabored ventilation, respiratory function stable and patient connected to nasal cannula oxygen Cardiovascular status: blood pressure returned to baseline and stable Postop Assessment: no apparent nausea or vomiting Anesthetic complications: no    Last Vitals:  Vitals:   08/13/18 1000 08/13/18 1015  BP: 110/65 126/63  Pulse: (!) 57 70  Resp: 11 16  Temp:  36.5 C  SpO2: 98% 96%    Last Pain:  Vitals:   08/13/18 1015  TempSrc:   PainSc: 0-No pain                 Selyna Klahn

## 2018-08-13 NOTE — Transfer of Care (Signed)
Immediate Anesthesia Transfer of Care Note  Patient: [name]  Procedure(s) Performed: Right hallux metatarsal phalangeal joint cheilectomy and joint resurfacing (Right Foot)  Patient Location: PACU  Anesthesia Type:GA combined with regional for post-op pain  Level of Consciousness: drowsy and patient cooperative  Airway & Oxygen Therapy: Patient Spontanous Breathing and Patient connected to face mask oxygen  Post-op Assessment: Report given to RN and Post -op Vital signs reviewed and stable  Post vital signs: Reviewed and stable  Last Vitals:  Vitals Value Taken Time  BP    Temp    Pulse    Resp    SpO2      Last Pain:  Vitals:   08/13/18 0817  TempSrc:   PainSc: 0-No pain      Patients Stated Pain Goal: 3 ([id number])  Complications: No apparent anesthesia complications

## 2018-08-13 NOTE — Progress Notes (Signed)
Assisted Dr. Oddono with right, ultrasound guided, popliteal block. Side rails up, monitors on throughout procedure. See vital signs in flow sheet. Tolerated Procedure well. 

## 2018-08-14 NOTE — Addendum Note (Signed)
Addendum  created 08/14/18 1033 by Janeece Riggers, MD   Intraprocedure Blocks edited, Sign clinical note

## 2020-01-15 ENCOUNTER — Ambulatory Visit: Payer: Commercial Managed Care - PPO | Attending: Internal Medicine

## 2020-01-15 DIAGNOSIS — Z23 Encounter for immunization: Secondary | ICD-10-CM

## 2020-01-15 NOTE — Progress Notes (Signed)
   Covid-19 Vaccination Clinic  Name:  [name]    MRN: [id number] DOB: [birth year]  01/15/2020  [name] was observed post Covid-19 immunization for 15 minutes without incident. She was provided with Vaccine Information Sheet and instruction to access the V-Safe system.   [name] was instructed to call 911 with any severe reactions post vaccine: Marland Kitchen Difficulty breathing  . Swelling of face and throat  . A fast heartbeat  . A bad rash all over body  . Dizziness and weakness   Immunizations Administered    Name Date Dose VIS Date Route   Pfizer COVID-19 Vaccine 01/15/2020  1:22 PM 0.3 mL 09/17/2019 Intramuscular   Manufacturer: [employer], [employer]   Lot: [id number]   [place]: [id number]

## 2020-02-07 ENCOUNTER — Ambulatory Visit: Payer: Commercial Managed Care - PPO | Attending: Internal Medicine

## 2020-02-07 DIAGNOSIS — Z23 Encounter for immunization: Secondary | ICD-10-CM

## 2020-02-07 NOTE — Progress Notes (Signed)
   Covid-19 Vaccination Clinic  Name:  [name]    MRN: [id number] DOB: 1961-07-19  02/07/2020  [name] was observed post Covid-19 immunization for 15 minutes without incident. She was provided with Vaccine Information Sheet and instruction to access the V-Safe system.   [name] was instructed to call 911 with any severe reactions post vaccine: Marland Kitchen Difficulty breathing  . Swelling of face and throat  . A fast heartbeat  . A bad rash all over body  . Dizziness and weakness   Immunizations Administered    Name Date Dose VIS Date Route   Pfizer COVID-19 Vaccine 02/07/2020 10:01 AM 0.3 mL 12/01/2018 Intramuscular   Manufacturer: [place]   Lot: [id number]   [place]: [id number]

## 2020-02-08 ENCOUNTER — Ambulatory Visit: Payer: Commercial Managed Care - PPO

## 2021-02-15 ENCOUNTER — Emergency Department (HOSPITAL_BASED_OUTPATIENT_CLINIC_OR_DEPARTMENT_OTHER)
Admission: EM | Admit: 2021-02-15 | Discharge: 2021-02-15 | Disposition: A | Discharge status: Home / Self Care | Payer: Commercial Managed Care - PPO | Attending: Emergency Medicine | Admitting: Emergency Medicine

## 2021-02-15 ENCOUNTER — Encounter (HOSPITAL_BASED_OUTPATIENT_CLINIC_OR_DEPARTMENT_OTHER): Payer: Self-pay

## 2021-02-15 ENCOUNTER — Other Ambulatory Visit: Payer: Self-pay

## 2021-02-15 DIAGNOSIS — Z7982 Long term (current) use of aspirin: Secondary | ICD-10-CM | POA: Diagnosis not present

## 2021-02-15 DIAGNOSIS — M25512 Pain in left shoulder: Secondary | ICD-10-CM | POA: Diagnosis not present

## 2021-02-15 DIAGNOSIS — Z96643 Presence of artificial hip joint, bilateral: Secondary | ICD-10-CM | POA: Diagnosis not present

## 2021-02-15 DIAGNOSIS — I10 Essential (primary) hypertension: Secondary | ICD-10-CM | POA: Insufficient documentation

## 2021-02-15 DIAGNOSIS — M791 Myalgia, unspecified site: Secondary | ICD-10-CM

## 2021-02-15 DIAGNOSIS — R0789 Other chest pain: Secondary | ICD-10-CM | POA: Insufficient documentation

## 2021-02-15 NOTE — ED Triage Notes (Signed)
Pt arrived POV for left shoulder pain/burning sensation. Pt is scheduled for shoulder surgery June 28th but this morning the burning sensation concerned her. Pt states the burning goes down into her left breast and under her arm.

## 2021-02-15 NOTE — ED Provider Notes (Signed)
[facility] EMERGENCY DEPT Provider Note  CSN: [id number] Arrival date & time: 02/15/21 0569  Chief Complaint(s) Shoulder Pain  HPI [name] is a 60 y.o. female with a past medical history listed below including acromegaly related arthritis here for left-sided chest wall and shoulder pain described as a burning sensation.  Patient reports that the pain has been ongoing for several months and has been fluctuating in nature.  Over the past several weeks she has had more increased flares of her pain.  Pain is described as a burning sensation coming along the left side of the chest under the axilla.  Pain is worse with movement and palpation.  She denies any shortness of breath.  No overt substernal precordial chest pain.  No coughing or congestion.  Patient takes scheduled naproxen and Tylenol twice daily for the pain as well as a muscle relaxer at night.  She denies any rashes.  Trauma.  HPI  Past Medical History Past Medical History:  Diagnosis Date  . 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 Complication of anesthesia   . GERD (gastroesophageal reflux disease)   . OSA (obstructive sleep apnea)    cpap does not use since brain tumore rmoved breathes better  . Osteoarthritis   . Osteoarthritis   . PONV (postoperative nausea and vomiting)    Patient Active Problem List   Diagnosis Date Noted  . Pain of left hip joint 11/07/2017  . History of acromegaly 04/04/2017  . Impaired fasting glucose 04/04/2017  . OA (osteoarthritis) of hip 10/23/2016  . 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 Hyponatremia 05/07/2012  . Dehydration 05/07/2012  . Nausea & vomiting 05/07/2012  . UTI (lower urinary tract infection) 05/07/2012  . HTN (hypertension) 04/29/2012  . Pituitary mass ([place]) 04/29/2012  . Acromegaly ([place]) 04/01/2012  . Pituitary adenoma-resected 2013 04/01/2012  . Obstructive sleep apnea 04/01/2012   Home  Medication(s) Prior to Admission medications   Medication Sig Start Date End Date Taking? Authorizing Provider  acetaminophen (TYLENOL) 500 MG tablet Take 1,000 mg by mouth daily as needed for moderate pain or headache.    [provider]  aspirin EC 325 MG EC tablet Take 1 tablet (325 mg total) by mouth daily with breakfast. 01/29/18   [place], Museum/gallery conservator, PA-C  conjugated estrogens (PREMARIN) vaginal cream Place 1 Applicatorful vaginally 2 (two) times a week.    [provider]  docusate sodium (COLACE) 100 MG capsule Take 1 capsule (100 mg total) by mouth 2 (two) times daily. While taking narcotic pain medicine. 08/13/18   Corky Sing, PA-C  lactobacillus acidophilus (BACID) TABS tablet Take 2 tablets by mouth 3 (three) times daily.    [provider]  Magnesium 500 MG CAPS Take 500 mg by mouth daily.     [provider]  Omega-3 Fatty Acids (FISH OIL ULTRA) 1400 MG CAPS Take 2,800 mg by mouth daily.    [provider]  ondansetron (ZOFRAN) 4 MG tablet Take 1 tablet (4 mg total) by mouth daily as needed for nausea or vomiting. 08/13/18   Corky Sing, PA-C  raNITIdine HCl (ZANTAC PO) Take 1 tablet by mouth daily as needed (acid reflux).    [provider]  senna (SENOKOT) 8.6 MG TABS tablet Take 2 tablets (17.2 mg total) by mouth 2 (two) times daily. 08/13/18   Corky Sing, PA-C  Past Surgical History Past Surgical History:  Procedure Laterality Date  . appendectomy  04/2008  . bladder suspension, unspecified  yrs ago  . burnionectomy, bilateral    . colonscopy  match 2019   endoscopy also  . elbow ligament surgery right remote    . HEMORRHOID SURGERY  06/2007  . mesh removal due to an abcess      Bladder.  . nerve decompression right c5 and nerve ablation c5 and c6    . PITUITARY SURGERY   2013   UVa/ Pituitary adenoma w/ acromegaly  . rotator cuff repair right shoulder  11/2010  . TOTAL HIP ARTHROPLASTY Right 10/23/2016   Procedure: RIGHT TOTAL HIP ARTHROPLASTY ANTERIOR APPROACH;  Surgeon: Gaynelle Arabian, MD;  Location: [facility] ORS;  Service: Orthopedics;  Laterality: Right;  . TOTAL HIP ARTHROPLASTY Left 01/28/2018   Procedure: LEFT TOTAL HIP ARTHROPLASTY ANTERIOR APPROACH;  Surgeon: Gaynelle Arabian, MD;  Location: [facility] ORS;  Service: Orthopedics;  Laterality: Left;   Family History Family History  Problem Relation Age of Onset  . Heart disease Father   . Coronary artery disease Father   . Heart disease Mother   . Coronary artery disease Mother   . Sleep apnea Sister     Social History Social History   Tobacco Use  . Smoking status: Never Smoker  . Smokeless tobacco: Never Used  Vaping Use  . Vaping Use: Never used  Substance Use Topics  . Alcohol use: No  . Drug use: No   Allergies Epinephrine, Codeine, and Xarelto [rivaroxaban]  Review of Systems Review of Systems All other systems are reviewed and are negative for acute change except as noted in the HPI  Physical Exam Vital Signs  I have reviewed the triage vital signs BP (!) 193/89 (BP Location: Right Arm)   Pulse 85   Temp 98.2 F (36.8 C) (Oral)   Resp 18   Ht 5\' 2"  (1.575 m)   Wt 77.1 kg   SpO2 99%   BMI 31.09 kg/m   Physical Exam Vitals reviewed.  Constitutional:      General: She is not in acute distress.    Appearance: She is well-developed. She is not diaphoretic.  HENT:     Head: Normocephalic and atraumatic.     Right Ear: External ear normal.     Left Ear: External ear normal.     Nose: Nose normal.  Eyes:     General: No scleral icterus.    Conjunctiva/sclera: Conjunctivae normal.  Neck:     Trachea: Phonation normal.  Cardiovascular:     Rate and Rhythm: Normal rate and regular rhythm.  Pulmonary:     Effort: Pulmonary effort is normal. No respiratory distress.     Breath  sounds: No stridor.  Chest:     Chest wall: Tenderness present.    Abdominal:     General: There is no distension.  Musculoskeletal:        General: Normal range of motion.     Cervical back: Normal range of motion.  Neurological:     Mental Status: She is alert and oriented to person, place, and time.  Psychiatric:        Behavior: Behavior normal.     ED Results and Treatments Labs (all labs ordered are listed, but only abnormal results are displayed) Labs Reviewed - No data to display  EKG  EKG Interpretation  Date/Time:    Ventricular Rate:    PR Interval:    QRS Duration:   QT Interval:    QTC Calculation:   R Axis:     Text Interpretation:        Radiology No results found.  Pertinent labs & imaging results that were available during my care of the patient were reviewed by me and considered in my medical decision making (see chart for details).  Medications Ordered in ED Medications - No data to display                                                                                                                                  Procedures Procedures  (including critical care time)  Medical Decision Making / ED Course I have reviewed the nursing notes for this encounter and the patient's prior records (if available in EHR or on provided paperwork).   [name] was evaluated in Emergency Department on 02/15/2021 for the symptoms described in the history of present illness. She was evaluated in the context of the global COVID-19 pandemic, which necessitated consideration that the patient might be at risk for infection with the SARS-CoV-2 virus that causes COVID-19. Institutional protocols and algorithms that pertain to the evaluation of patients at risk for COVID-19 are in a state of rapid change based on information released by  regulatory bodies including the CDC and federal and state organizations. These policies and algorithms were followed during the patient's care in the ED.  Flare of recurring left sided burning pain. Appears to be muscular in nature. Possible contributory arthritis. Not consistent with cardiac etiology. Doubt PE, dissection, or esophageal perforation.  Patient declined medication here. Discussed supportive management.      Final Clinical Impression(s) / ED Diagnoses Final diagnoses:  Muscular pain   The patient appears reasonably screened and/or stabilized for discharge and I doubt any other medical condition or other [facility] requiring further screening, evaluation, or treatment in the ED at this time prior to discharge. Safe for discharge with strict return precautions.  Disposition: Discharge  Condition: Good  I have discussed the results, Dx and Tx plan with the patient/family who expressed understanding and agree(s) with the plan. Discharge instructions discussed at length. The patient/family was given strict return precautions who verbalized understanding of the instructions. No further questions at time of discharge.    ED Discharge Orders    None       Follow Up: Lujean Amel, MD [address] [place] [id number] [phone]  Call  as needed      This chart was dictated using voice recognition software.  Despite best efforts to proofread,  errors can occur which can change the documentation meaning.   Fatima Blank, MD 02/15/21 [phone]

## 2021-02-15 NOTE — Discharge Instructions (Signed)
You may use over-the-counter Acetaminophen (Tylenol) 1000mg  every 8 hours, topical muscle creams such as SalonPas, [employer], Bengay, etc. Please start doing light exercises (yoga), stretch, apply ice or heat (whichever helps), and have massage therapy for additional assistance.

## 2021-03-27 NOTE — Patient Instructions (Signed)
DUE TO COVID-19 ONLY ONE VISITOR IS ALLOWED TO COME WITH YOU AND STAY IN THE WAITING ROOM ONLY DURING PRE OP AND PROCEDURE DAY OF SURGERY. THE 1 VISITOR  MAY VISIT WITH YOU AFTER SURGERY IN YOUR PRIVATE ROOM DURING VISITING HOURS ONLY!                [name]     Your procedure is scheduled on: 04/05/21   Report to [facility] Main  Entrance   Report to Short stay at 5:15 AM     Call this number if you have problems the morning of surgery [place], NO CHEWING GUM [place].   No food after midnight.    You may have clear liquid until 4:30 AM.    At 4:00 AM drink pre surgery drink.   Nothing by mouth after 4:30 AM.    Take these medicines the morning of surgery with A SIP OF WATER: Xanax if needed                                 You may not have any metal on your body including hair pins and              piercings  Do not wear jewelry, make-up, lotions, powders or perfumes, deodorant             Do not wear nail polish on your fingernails.  Do not shave  48 hours prior to surgery.              Do not bring valuables to the hospital. [place].  Contacts, dentures or bridgework may not be worn into surgery.       Patients discharged the day of surgery will not be allowed to drive home.   IF YOU ARE HAVING SURGERY AND GOING HOME THE SAME DAY, YOU MUST HAVE AN ADULT TO DRIVE YOU HOME AND BE WITH YOU FOR 24 HOURS.  YOU MAY GO HOME BY TAXI OR UBER OR ORTHERWISE, BUT AN ADULT MUST ACCOMPANY YOU HOME AND STAY WITH YOU FOR 24 HOURS.  Name and phone number of your driver:  Special Instructions: N/A              Please read over the following fact sheets you were given: _____________________________________________________________________             [facility]- Preparing for Total Shoulder Arthroplasty    Before surgery, you can  play an important role. Because skin is not sterile, your skin needs to be as free of germs as possible. You can reduce the number of germs on your skin by using the following products. Benzoyl Peroxide Gel Reduces the number of germs present on the skin Applied twice a day to shoulder area starting two days before surgery    ==================================================================  Please follow these instructions carefully:  BENZOYL PEROXIDE 5% GEL  Please do not use if you have an allergy to benzoyl peroxide.   If your skin becomes reddened/irritated stop using the benzoyl peroxide.  Starting two days before surgery, apply as follows: Apply benzoyl peroxide in the morning and at night. Apply after taking a shower. If you are not taking a shower clean entire shoulder  front, back, and side along with the armpit with a clean wet washcloth.  Place a quarter-sized dollop on your shoulder and rub in thoroughly, making sure to cover the front, back, and side of your shoulder, along with the armpit.   2 days before ____ AM   ____ PM              1 day before ____ AM   ____ PM                         Do this twice a day for two days.  (Last application is the night before surgery, AFTER using the CHG soap as described below).  Do NOT apply benzoyl peroxide gel on the day of surgery.   [place] - Preparing for Surgery Before surgery, you can play an important role.  Because skin is not sterile, your skin needs to be as free of germs as possible.  You can reduce the number of germs on your skin by washing with CHG (chlorahexidine gluconate) soap before surgery.  CHG is an antiseptic cleaner which kills germs and bonds with the skin to continue killing germs even after washing. Please DO NOT use if you have an allergy to CHG or antibacterial soaps.  If your skin becomes reddened/irritated stop using the CHG and inform your nurse when you arrive at Short Stay. Do not shave (including  legs and underarms) for at least 48 hours prior to the first CHG shower.   Please follow these instructions carefully:  1.  Shower with CHG Soap the night before surgery and the  morning of Surgery.  2.  If you choose to wash your hair, wash your hair first as usual with your  normal  shampoo.  3.  After you shampoo, rinse your hair and body thoroughly to remove the  shampoo.                                        4.  Use CHG as you would any other liquid soap.  You can apply chg directly  to the skin and wash                       Gently with a scrungie or clean washcloth.  5.  Apply the CHG Soap to your body ONLY FROM THE NECK DOWN.   Do not use on face/ open                           Wound or open sores. Avoid contact with eyes, ears mouth and genitals (private parts).                       Wash face,  Genitals (private parts) with your normal soap.             6.  Wash thoroughly, paying special attention to the area where your surgery  will be performed.  7.  Thoroughly rinse your body with warm water from the neck down.  8.  DO NOT shower/wash with your normal soap after using and rinsing off  the CHG Soap.             9.  Pat yourself dry with a clean towel.  10.  Wear clean pajamas.            11.  Place clean sheets on your bed the night of your first shower and do not  sleep with pets. Day of Surgery : Do not apply any lotions/deodorants the morning of surgery.  Please wear clean clothes to the hospital/surgery center.  FAILURE TO FOLLOW THESE INSTRUCTIONS MAY RESULT IN THE CANCELLATION OF YOUR SURGERY PATIENT SIGNATURE_________________________________  NURSE SIGNATURE__________________________________  ________________________________________________________________________   [name]  An incentive spirometer is a tool that can help keep your lungs clear and active. This tool measures how well you are filling your lungs with each breath. Taking long deep  breaths may help reverse or decrease the chance of developing breathing (pulmonary) problems (especially infection) following: A long period of time when you are unable to move or be active. BEFORE THE PROCEDURE  If the spirometer includes an indicator to show your best effort, your nurse or respiratory therapist will set it to a desired goal. If possible, sit up straight or lean slightly forward. Try not to slouch. Hold the incentive spirometer in an upright position. INSTRUCTIONS FOR USE  Sit on the edge of your bed if possible, or sit up as far as you can in bed or on a chair. Hold the incentive spirometer in an upright position. Breathe out normally. Place the mouthpiece in your mouth and seal your lips tightly around it. Breathe in slowly and as deeply as possible, raising the piston or the ball toward the top of the column. Hold your breath for 3-5 seconds or for as long as possible. Allow the piston or ball to fall to the bottom of the column. Remove the mouthpiece from your mouth and breathe out normally. Rest for a few seconds and repeat Steps 1 through 7 at least 10 times every 1-2 hours when you are awake. Take your time and take a few normal breaths between deep breaths. The spirometer may include an indicator to show your best effort. Use the indicator as a goal to work toward during each repetition. After each set of 10 deep breaths, practice coughing to be sure your lungs are clear. If you have an incision (the cut made at the time of surgery), support your incision when coughing by placing a pillow or rolled up towels firmly against it. Once you are able to get out of bed, walk around indoors and cough well. You may stop using the incentive spirometer when instructed by your caregiver.  RISKS AND COMPLICATIONS Take your time so you do not get dizzy or light-headed. If you are in pain, you may need to take or ask for pain medication before doing incentive spirometry. It is harder  to take a deep breath if you are having pain. AFTER USE Rest and breathe slowly and easily. It can be helpful to keep track of a log of your progress. Your caregiver can provide you with a simple table to help with this. If you are using the spirometer at home, follow these instructions: [place] IF:  You are having difficultly using the spirometer. You have trouble using the spirometer as often as instructed. Your pain medication is not giving enough relief while using the spirometer. You develop fever of 100.5 F (38.1 C) or higher. SEEK IMMEDIATE MEDICAL CARE IF:  You cough up bloody sputum that had not been present before. You develop fever of 102 F (38.9 C) or greater. You develop worsening pain at or  near the incision site. MAKE SURE YOU:  Understand these instructions. Will watch your condition. Will get help right away if you are not doing well or get worse. Document Released: 02/03/2007 Document Revised: 12/16/2011 Document Reviewed: 04/06/2007 [facility] Patient Information 2014 [place], [place].   ________________________________________________________________________

## 2021-03-29 ENCOUNTER — Other Ambulatory Visit: Payer: Self-pay

## 2021-03-29 ENCOUNTER — Encounter (HOSPITAL_COMMUNITY): Payer: Self-pay

## 2021-03-29 ENCOUNTER — Encounter (HOSPITAL_COMMUNITY)
Admission: RE | Admit: 2021-03-29 | Discharge: 2021-03-29 | Disposition: A | Discharge status: Home / Self Care | Payer: Commercial Managed Care - PPO | Source: Ambulatory Visit | Attending: Orthopedic Surgery | Admitting: Orthopedic Surgery

## 2021-03-29 DIAGNOSIS — Z01812 Encounter for preprocedural laboratory examination: Secondary | ICD-10-CM | POA: Diagnosis not present

## 2021-03-29 HISTORY — DX: Anxiety disorder, unspecified: F41.9

## 2021-03-29 LAB — CBC
HCT: 43.7 % (ref 36.0–46.0)
Hemoglobin: 14.3 g/dL (ref 12.0–15.0)
MCH: 30.4 pg (ref 26.0–34.0)
MCHC: 32.7 g/dL (ref 30.0–36.0)
MCV: 93 fL (ref 80.0–100.0)
Platelets: 297 10*3/uL (ref 150–400)
RBC: 4.7 MIL/uL (ref 3.87–5.11)
RDW: 13.8 % (ref 11.5–15.5)
WBC: 7.5 10*3/uL (ref 4.0–10.5)
nRBC: 0 % (ref 0.0–0.2)

## 2021-03-29 LAB — SURGICAL PCR SCREEN
MRSA, PCR: NEGATIVE
Staphylococcus aureus: NEGATIVE

## 2021-03-29 NOTE — Progress Notes (Addendum)
COVID Vaccine Completed:Yes  Date COVID Vaccine completed:02/07/20 COVID vaccine manufacturer: Pfizer     PCP - Dr. Keturah Barre. Dorthy Cooler. LOV 11/16/20 Cardiologist - none Endocrinologist- Dr. Keturah Barre. [place]. LOV 02/22/21  Chest x-ray - no EKG - no Stress Test - no ECHO - no Cardiac Cath - no Pacemaker/ICD device last checked:NA  Sleep Study - yes CPAP - not needed as much since her pituitary gland was removed.  Fasting Blood Sugar - NA Checks Blood Sugar _____ times a day  Blood Thinner Instructions:NA Aspirin Instructions: Last Dose:  Anesthesia review: no  Patient denies shortness of breath, fever, cough and chest pain at PAT appointment Yes. Pt can climb 4 flights of stairs, do house work and  ADLs with out SOB  Patient verbalized understanding of instructions that were given to them at the PAT appointment. Patient was also instructed that they will need to review over the PAT instructions again at home before surgery. Yes

## 2021-04-04 NOTE — Anesthesia Preprocedure Evaluation (Addendum)
Anesthesia Evaluation  Patient identified by MRN, date of birth, ID band Patient awake    Reviewed: Allergy & Precautions, H&P , NPO status , Patient's Chart, lab work & pertinent test results  History of Anesthesia Complications (+) PONV and history of anesthetic complications  Airway Mallampati: III  TM Distance: >3 FB Neck ROM: Full    Dental no notable dental hx. (+) Dental Advisory Given   Pulmonary sleep apnea and Continuous Positive Airway Pressure Ventilation ,    Pulmonary exam normal        Cardiovascular Exercise Tolerance: Good hypertension, Pt. on medications Normal cardiovascular exam     Neuro/Psych Pituitary mass S/P resection Acromegaly negative neurological ROS  negative psych ROS   GI/Hepatic Neg liver ROS, GERD  Medicated,  Endo/Other  negative endocrine ROS  Renal/GU negative Renal ROS  negative genitourinary   Musculoskeletal  (+) Arthritis , Osteoarthritis,  History of acromegaly   Abdominal   Peds  Hematology negative hematology ROS (+)   Anesthesia Other Findings   Reproductive/Obstetrics negative OB ROS                            Anesthesia Physical  Anesthesia Plan  ASA: 2  Anesthesia Plan: General   Post-op Pain Management: GA combined w/ Regional for post-op pain   Induction: Intravenous  PONV Risk Score and Plan: 4 or greater and Ondansetron, Dexamethasone, Treatment may vary due to age or medical condition, Midazolam and Scopolamine patch - Pre-op  Airway Management Planned: Oral ETT  Additional Equipment:   Intra-op Plan:   Post-operative Plan: Extubation in OR  Informed Consent: I have reviewed the patients History and Physical, chart, labs and discussed the procedure including the risks, benefits and alternatives for the proposed anesthesia with the patient or authorized representative who has indicated his/her understanding and acceptance.      Dental Advisory Given  Plan Discussed with: CRNA, Anesthesiologist and Surgeon  Anesthesia Plan Comments: ( )       Anesthesia Quick Evaluation

## 2021-04-05 ENCOUNTER — Encounter (HOSPITAL_COMMUNITY)
Admission: RE | Disposition: A | Discharge status: Home / Self Care | Payer: Self-pay | Source: Home / Self Care | Attending: Orthopedic Surgery

## 2021-04-05 ENCOUNTER — Ambulatory Visit (HOSPITAL_COMMUNITY): Payer: Commercial Managed Care - PPO | Admitting: Certified Registered Nurse Anesthetist

## 2021-04-05 ENCOUNTER — Ambulatory Visit (HOSPITAL_COMMUNITY)
Admission: RE | Admit: 2021-04-05 | Discharge: 2021-04-05 | Disposition: A | Discharge status: Home / Self Care | Payer: Commercial Managed Care - PPO | Attending: Orthopedic Surgery | Admitting: Orthopedic Surgery

## 2021-04-05 ENCOUNTER — Encounter (HOSPITAL_COMMUNITY): Payer: Self-pay | Admitting: Orthopedic Surgery

## 2021-04-05 DIAGNOSIS — Z96643 Presence of artificial hip joint, bilateral: Secondary | ICD-10-CM | POA: Insufficient documentation

## 2021-04-05 DIAGNOSIS — M19012 Primary osteoarthritis, left shoulder: Secondary | ICD-10-CM | POA: Diagnosis not present

## 2021-04-05 HISTORY — PX: TOTAL SHOULDER ARTHROPLASTY: SHX126

## 2021-04-05 SURGERY — ARTHROPLASTY, SHOULDER, TOTAL
Anesthesia: General | Site: Shoulder | Laterality: Left

## 2021-04-05 MED ORDER — MELOXICAM 15 MG PO TABS
15.0000 mg | ORAL_TABLET | Freq: Every day | ORAL | 1 refills | Status: AC | PRN
Start: 2021-04-05 — End: ?

## 2021-04-05 MED ORDER — PROPOFOL 1000 MG/100ML IV EMUL
INTRAVENOUS | Status: AC
  Filled 2021-04-05: qty 100

## 2021-04-05 MED ORDER — PHENYLEPHRINE HCL-NACL 10-0.9 MG/250ML-% IV SOLN
INTRAVENOUS | Status: DC | PRN
  Administered 2021-04-05: 40 ug/min via INTRAVENOUS

## 2021-04-05 MED ORDER — ORAL CARE MOUTH RINSE: 15.0000 mL | Freq: Once | OROMUCOSAL | Status: AC

## 2021-04-05 MED ORDER — MIDAZOLAM HCL 2 MG/2ML IJ SOLN
INTRAMUSCULAR | Status: AC
  Filled 2021-04-05: qty 2

## 2021-04-05 MED ORDER — OXYCODONE-ACETAMINOPHEN 5-325 MG PO TABS: 1.0000 | ORAL_TABLET | ORAL | 0 refills | Status: AC | PRN

## 2021-04-05 MED ORDER — METOCLOPRAMIDE HCL 5 MG/ML IJ SOLN: 5.0000 mg | Freq: Three times a day (TID) | INTRAMUSCULAR | Status: DC | PRN

## 2021-04-05 MED ORDER — BUPIVACAINE LIPOSOME 1.3 % IJ SUSP: INTRAMUSCULAR | Status: DC | PRN

## 2021-04-05 MED ORDER — ACETAMINOPHEN 325 MG PO TABS: 325.0000 mg | ORAL_TABLET | Freq: Four times a day (QID) | ORAL | Status: DC | PRN

## 2021-04-05 MED ORDER — ONDANSETRON HCL 4 MG/2ML IJ SOLN
INTRAMUSCULAR | Status: DC | PRN
  Administered 2021-04-05: 4 mg via INTRAVENOUS

## 2021-04-05 MED ORDER — HYDROMORPHONE HCL 1 MG/ML IJ SOLN: 0.5000 mg | INTRAMUSCULAR | Status: DC | PRN

## 2021-04-05 MED ORDER — TRANEXAMIC ACID 1000 MG/10ML IV SOLN: 1000.0000 mg | INTRAVENOUS | Status: DC

## 2021-04-05 MED ORDER — EPHEDRINE SULFATE-NACL 50-0.9 MG/10ML-% IV SOSY
PREFILLED_SYRINGE | INTRAVENOUS | Status: DC | PRN
  Administered 2021-04-05: 5 mg via INTRAVENOUS

## 2021-04-05 MED ORDER — LACTATED RINGERS IV BOLUS
250.0000 mL | Freq: Once | INTRAVENOUS | Status: AC
  Administered 2021-04-05: 250 mL via INTRAVENOUS

## 2021-04-05 MED ORDER — LACTATED RINGERS IV SOLN: INTRAVENOUS | Status: DC

## 2021-04-05 MED ORDER — PROPOFOL 500 MG/50ML IV EMUL
INTRAVENOUS | Status: DC | PRN
  Administered 2021-04-05: 25 ug/kg/min via INTRAVENOUS

## 2021-04-05 MED ORDER — ONDANSETRON HCL 4 MG/2ML IJ SOLN: 4.0000 mg | Freq: Four times a day (QID) | INTRAMUSCULAR | Status: DC | PRN

## 2021-04-05 MED ORDER — CYCLOBENZAPRINE HCL 10 MG PO TABS: 10.0000 mg | ORAL_TABLET | Freq: Three times a day (TID) | ORAL | 1 refills | Status: AC | PRN

## 2021-04-05 MED ORDER — OXYCODONE HCL 5 MG PO TABS: 5.0000 mg | ORAL_TABLET | ORAL | Status: DC | PRN

## 2021-04-05 MED ORDER — VANCOMYCIN HCL 1000 MG IV SOLR
INTRAVENOUS | Status: DC | PRN
  Administered 2021-04-05: 1000 mg via TOPICAL

## 2021-04-05 MED ORDER — TRANEXAMIC ACID-NACL 1000-0.7 MG/100ML-% IV SOLN
1000.0000 mg | INTRAVENOUS | Status: AC
  Administered 2021-04-05: 1000 mg via INTRAVENOUS
  Filled 2021-04-05 (×2): qty 100

## 2021-04-05 MED ORDER — 0.9 % SODIUM CHLORIDE (POUR BTL) OPTIME
TOPICAL | Status: DC | PRN
  Administered 2021-04-05: 1000 mL

## 2021-04-05 MED ORDER — SCOPOLAMINE 1 MG/3DAYS TD PT72
1.0000 | MEDICATED_PATCH | TRANSDERMAL | Status: DC
  Administered 2021-04-05: 1.5 mg via TRANSDERMAL
  Filled 2021-04-05: qty 1

## 2021-04-05 MED ORDER — FENTANYL CITRATE (PF) 100 MCG/2ML IJ SOLN
INTRAMUSCULAR | Status: AC
  Filled 2021-04-05: qty 2

## 2021-04-05 MED ORDER — PROMETHAZINE HCL 25 MG/ML IJ SOLN: 6.2500 mg | INTRAMUSCULAR | Status: DC | PRN

## 2021-04-05 MED ORDER — FENTANYL CITRATE (PF) 100 MCG/2ML IJ SOLN
INTRAMUSCULAR | Status: DC | PRN
  Administered 2021-04-05: 50 ug via INTRAVENOUS

## 2021-04-05 MED ORDER — PHENYLEPHRINE HCL (PRESSORS) 10 MG/ML IV SOLN
INTRAVENOUS | Status: AC
  Filled 2021-04-05: qty 1

## 2021-04-05 MED ORDER — BUPIVACAINE LIPOSOME 1.3 % IJ SUSP
INTRAMUSCULAR | Status: DC | PRN
  Administered 2021-04-05: 10 mL via PERINEURAL

## 2021-04-05 MED ORDER — ONDANSETRON HCL 4 MG PO TABS
4.0000 mg | ORAL_TABLET | Freq: Four times a day (QID) | ORAL | Status: DC | PRN
  Filled 2021-04-05: qty 1

## 2021-04-05 MED ORDER — ONDANSETRON HCL 4 MG/2ML IJ SOLN
INTRAMUSCULAR | Status: AC
  Filled 2021-04-05: qty 2

## 2021-04-05 MED ORDER — AMISULPRIDE (ANTIEMETIC) 5 MG/2ML IV SOLN: 10.0000 mg | Freq: Once | INTRAVENOUS | Status: DC | PRN

## 2021-04-05 MED ORDER — METOCLOPRAMIDE HCL 5 MG PO TABS
5.0000 mg | ORAL_TABLET | Freq: Three times a day (TID) | ORAL | Status: DC | PRN
  Filled 2021-04-05: qty 2

## 2021-04-05 MED ORDER — SUGAMMADEX SODIUM 200 MG/2ML IV SOLN
INTRAVENOUS | Status: DC | PRN
  Administered 2021-04-05: 200 mg via INTRAVENOUS

## 2021-04-05 MED ORDER — CELECOXIB 200 MG PO CAPS
200.0000 mg | ORAL_CAPSULE | Freq: Once | ORAL | Status: AC
  Administered 2021-04-05: 200 mg via ORAL
  Filled 2021-04-05: qty 1

## 2021-04-05 MED ORDER — DEXAMETHASONE SODIUM PHOSPHATE 10 MG/ML IJ SOLN
INTRAMUSCULAR | Status: AC
  Filled 2021-04-05: qty 1

## 2021-04-05 MED ORDER — PROPOFOL 10 MG/ML IV BOLUS
INTRAVENOUS | Status: AC
  Filled 2021-04-05: qty 20

## 2021-04-05 MED ORDER — ROCURONIUM BROMIDE 10 MG/ML (PF) SYRINGE
PREFILLED_SYRINGE | INTRAVENOUS | Status: AC
  Filled 2021-04-05: qty 10

## 2021-04-05 MED ORDER — VANCOMYCIN HCL 1000 MG IV SOLR
INTRAVENOUS | Status: AC
  Filled 2021-04-05: qty 1000

## 2021-04-05 MED ORDER — FENTANYL CITRATE (PF) 100 MCG/2ML IJ SOLN
25.0000 ug | INTRAMUSCULAR | Status: DC | PRN
  Administered 2021-04-05: 25 ug via INTRAVENOUS

## 2021-04-05 MED ORDER — CEFAZOLIN SODIUM-DEXTROSE 2-4 GM/100ML-% IV SOLN
INTRAVENOUS | Status: AC
  Filled 2021-04-05: qty 100

## 2021-04-05 MED ORDER — CHLORHEXIDINE GLUCONATE 0.12 % MT SOLN
15.0000 mL | Freq: Once | OROMUCOSAL | Status: AC
  Administered 2021-04-05: 15 mL via OROMUCOSAL

## 2021-04-05 MED ORDER — ONDANSETRON HCL 4 MG PO TABS: 4.0000 mg | ORAL_TABLET | Freq: Three times a day (TID) | ORAL | 0 refills | Status: AC | PRN

## 2021-04-05 MED ORDER — ROCURONIUM BROMIDE 10 MG/ML (PF) SYRINGE
PREFILLED_SYRINGE | INTRAVENOUS | Status: DC | PRN
  Administered 2021-04-05: 100 mg via INTRAVENOUS

## 2021-04-05 MED ORDER — DEXAMETHASONE SODIUM PHOSPHATE 4 MG/ML IJ SOLN
INTRAMUSCULAR | Status: DC | PRN
  Administered 2021-04-05: 5 mg via INTRAVENOUS

## 2021-04-05 MED ORDER — ACETAMINOPHEN 500 MG PO TABS
1000.0000 mg | ORAL_TABLET | Freq: Once | ORAL | Status: AC
  Administered 2021-04-05: 1000 mg via ORAL
  Filled 2021-04-05: qty 2

## 2021-04-05 MED ORDER — BUPIVACAINE HCL (PF) 0.5 % IJ SOLN
INTRAMUSCULAR | Status: DC | PRN
  Administered 2021-04-05: 15 mL via PERINEURAL

## 2021-04-05 MED ORDER — LIDOCAINE 2% (20 MG/ML) 5 ML SYRINGE
INTRAMUSCULAR | Status: AC
  Filled 2021-04-05: qty 5

## 2021-04-05 MED ORDER — CEFAZOLIN SODIUM-DEXTROSE 2-4 GM/100ML-% IV SOLN
2.0000 g | INTRAVENOUS | Status: AC
  Administered 2021-04-05: 2 g via INTRAVENOUS
  Filled 2021-04-05: qty 100

## 2021-04-05 MED ORDER — PROPOFOL 10 MG/ML IV BOLUS
INTRAVENOUS | Status: DC | PRN
  Administered 2021-04-05: 200 mg via INTRAVENOUS

## 2021-04-05 MED ORDER — MIDAZOLAM HCL 5 MG/5ML IJ SOLN
INTRAMUSCULAR | Status: DC | PRN
  Administered 2021-04-05 (×2): 1 mg via INTRAVENOUS

## 2021-04-05 MED ORDER — LACTATED RINGERS IV BOLUS
500.0000 mL | Freq: Once | INTRAVENOUS | Status: AC
  Administered 2021-04-05: 500 mL via INTRAVENOUS

## 2021-04-05 SURGICAL SUPPLY — 65 items
BAG COUNTER SPONGE SURGICOUNT (BAG) IMPLANT
BAG ZIPLOCK 12X15 (MISCELLANEOUS) ×2 IMPLANT
BIT DRILL 2.0X128 (BIT) ×2 IMPLANT
BLADE SAW SGTL 83.5X18.5 (BLADE) ×2 IMPLANT
BODY TRUNION ECLIPSE 39 SL (Shoulder) ×2 IMPLANT
CEMENT BONE DEPUY (Cement) ×2 IMPLANT
COOLER ICEMAN CLASSIC (MISCELLANEOUS) ×2 IMPLANT
COVER BACK TABLE 60X90IN (DRAPES) ×2 IMPLANT
COVER SURGICAL LIGHT HANDLE (MISCELLANEOUS) ×2 IMPLANT
DERMABOND ADVANCED (GAUZE/BANDAGES/DRESSINGS) ×1
DERMABOND ADVANCED .7 DNX12 (GAUZE/BANDAGES/DRESSINGS) ×1 IMPLANT
DRAPE ORTHO SPLIT 77X108 STRL (DRAPES) ×2
DRAPE SHEET LG 3/4 BI-LAMINATE (DRAPES) ×2 IMPLANT
DRAPE SURG 17X11 SM STRL (DRAPES) ×2 IMPLANT
DRAPE SURG ORHT 6 SPLT 77X108 (DRAPES) ×2 IMPLANT
DRAPE TOP 10253 STERILE (DRAPES) ×2 IMPLANT
DRAPE U-SHAPE 47X51 STRL (DRAPES) ×2 IMPLANT
DRSG AQUACEL AG ADV 3.5X 6 (GAUZE/BANDAGES/DRESSINGS) ×2 IMPLANT
DRSG AQUACEL AG ADV 3.5X10 (GAUZE/BANDAGES/DRESSINGS) ×2 IMPLANT
DURAPREP 26ML APPLICATOR (WOUND CARE) ×2 IMPLANT
ELECT BLADE TIP CTD 4 INCH (ELECTRODE) ×2 IMPLANT
ELECT REM PT RETURN 15FT ADLT (MISCELLANEOUS) ×2 IMPLANT
FACESHIELD WRAPAROUND (MASK) ×8 IMPLANT
GLENOID WITH CLEAT SM (Miscellaneous) ×2 IMPLANT
GLOVE SRG 8 PF TXTR STRL LF DI (GLOVE) ×1 IMPLANT
GLOVE SURG ENC MOIS LTX SZ7 (GLOVE) ×2 IMPLANT
GLOVE SURG ENC MOIS LTX SZ7.5 (GLOVE) ×2 IMPLANT
GLOVE SURG ENC MOIS LTX SZ8 (GLOVE) ×2 IMPLANT
GLOVE SURG UNDER POLY LF SZ7 (GLOVE) ×2 IMPLANT
GLOVE SURG UNDER POLY LF SZ8 (GLOVE) ×1
GOWN STRL REUS W/TWL LRG LVL3 (GOWN DISPOSABLE) ×4 IMPLANT
HEAD HUMERAL ECLIPSE 39/16 (Shoulder) ×2 IMPLANT
IMPL ECLIPSE SPEEDCAP (Shoulder) ×1 IMPLANT
IMPLANT ECLIPSE SPEEDCAP (Shoulder) ×2 IMPLANT
KIT BASIN OR (CUSTOM PROCEDURE TRAY) ×2 IMPLANT
KIT TURNOVER KIT A (KITS) ×2 IMPLANT
MANIFOLD NEPTUNE II (INSTRUMENTS) ×2 IMPLANT
NEEDLE TAPERED W/ NITINOL LOOP (MISCELLANEOUS) IMPLANT
NS IRRIG 1000ML POUR BTL (IV SOLUTION) ×2 IMPLANT
PACK SHOULDER (CUSTOM PROCEDURE TRAY) ×2 IMPLANT
PAD COLD SHLDR WRAP-ON (PAD) IMPLANT
PIN NITINOL TARGETER 2.8 (PIN) IMPLANT
PIN SET MODULAR GLENOID SYSTEM (PIN) ×2 IMPLANT
PROTECTOR NERVE ULNAR (MISCELLANEOUS) ×2 IMPLANT
RESTRAINT HEAD UNIVERSAL NS (MISCELLANEOUS) ×2 IMPLANT
SCREW MED ECLIPSE 35 (Screw) ×2 IMPLANT
SIZER ECLIPSE CAGE SCREW (ORTHOPEDIC DISPOSABLE SUPPLIES) ×2 IMPLANT
SLING ARM FOAM STRAP LRG (SOFTGOODS) IMPLANT
SLING ARM FOAM STRAP MED (SOFTGOODS) ×2 IMPLANT
SMARTMIX MINI TOWER (MISCELLANEOUS)
SPONGE T-LAP 18X18 ~~LOC~~+RFID (SPONGE) ×2 IMPLANT
SUCTION FRAZIER HANDLE 12FR (TUBING) ×1
SUCTION TUBE FRAZIER 12FR DISP (TUBING) ×1 IMPLANT
SUT FIBERWIRE #2 38 T-5 BLUE (SUTURE)
SUT MNCRL AB 3-0 PS2 18 (SUTURE) ×2 IMPLANT
SUT MON AB 2-0 CT1 36 (SUTURE) ×2 IMPLANT
SUT VIC AB 1 CT1 36 (SUTURE) ×2 IMPLANT
SUTURE FIBERWR #2 38 T-5 BLUE (SUTURE) IMPLANT
SUTURE TAPE 1.3 40 TPR END (SUTURE) ×1 IMPLANT
SUTURETAPE 1.3 40 TPR END (SUTURE) ×2
TOWEL OR 17X26 10 PK STRL BLUE (TOWEL DISPOSABLE) ×2 IMPLANT
TOWEL OR NON WOVEN STRL DISP B (DISPOSABLE) ×2 IMPLANT
TOWER SMARTMIX MINI (MISCELLANEOUS) IMPLANT
WATER STERILE IRR 1000ML POUR (IV SOLUTION) ×4 IMPLANT
YANKAUER SUCT BULB TIP 10FT TU (MISCELLANEOUS) ×2 IMPLANT

## 2021-04-05 NOTE — H&P (Signed)
[name]    Chief Complaint: left shoulder osteoarthritis HPI: The patient is a 60 y.o. female with chronic and progressively increasing left shoulder pain related to severe osteoarthritis.  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   nerve decompression right c5 and nerve ablation c5 and c6     PITUITARY SURGERY  2013   UVa/ Pituitary adenoma w/ acromegaly   rotator cuff repair right shoulder  11/2010   TOTAL HIP ARTHROPLASTY Right 10/23/2016   Procedure: RIGHT TOTAL HIP ARTHROPLASTY ANTERIOR APPROACH;  Surgeon: Gaynelle Arabian, MD;  Location: [facility];  Service: Orthopedics;  Laterality: Right;   TOTAL HIP ARTHROPLASTY Left 01/28/2018   Procedure: LEFT TOTAL HIP ARTHROPLASTY ANTERIOR APPROACH;  Surgeon: Gaynelle Arabian, MD;  Location: [facility];  Service: Orthopedics;  Laterality: Left;    Family History  Problem Relation Age of Onset   Heart disease Father    Coronary artery disease Father    Heart disease Mother    Coronary artery disease Mother    Sleep apnea Sister     Social History:  reports that she has never smoked. She has never used smokeless  tobacco. She reports that she does not drink alcohol and does not use drugs.   Medications Prior to Admission  Medication Sig Dispense Refill   acetaminophen (TYLENOL) 500 MG tablet Take 1,000 mg by mouth daily as needed for moderate pain or headache.     ALPRAZolam (XANAX) 0.25 MG tablet Take 0.25 mg by mouth 2 (two) times daily as needed for anxiety.     Camphor-Menthol-Methyl Sal (SALONPAS DEEP RELIEVING) 3.10-16-13 % GEL Apply 1 application topically daily as needed (pain).     conjugated estrogens (PREMARIN) vaginal cream Place 1 Applicatorful vaginally daily as needed (dryness).     Diclofenac Sodium 2 % SOLN Apply 1 Pump topically daily as needed (pain).     Magnesium 500 MG CAPS Take 500 mg by mouth daily.      meloxicam (MOBIC) 15 MG tablet Take 15 mg by mouth daily as needed for pain.     naproxen (NAPROSYN) 500 MG tablet Take 500 mg by mouth 2 (two) times daily as needed for moderate pain.     Omega-3 Fatty Acids (FISH OIL ULTRA) 1400 MG CAPS Take 2,800 mg by mouth daily.       Physical Exam: Left shoulder demonstrates painful and guarded motion as noted at recent office visits.  She maintains excellent strength to manual muscle testing.  Plain radiographs confirm severe arthritis with complete obliteration of the joint space, subchondral  sclerosis, and peripheral osteophyte formation.  Vitals  Temp:  [97.7 F (36.5 C)] 97.7 F (36.5 C) (06/30 0533) Pulse Rate:  [72] 72 (06/30 0533) Resp:  [10] 10 (06/30 0533) BP: (141)/(75) 141/75 (06/30 0533) SpO2:  [100 %] 100 % (06/30 0533) Weight:  [78.5 kg] 78.5 kg (06/30 0607)  Assessment/Plan  Impression: left shoulder osteoarthritis  Plan of Action: Procedure(s): TOTAL SHOULDER ARTHROPLASTY  [name] [name] 04/05/2021, 6:27 AM Contact # [phone]

## 2021-04-05 NOTE — Discharge Instructions (Signed)
 Kevin M. Supple, M.D., F.A.A.O.S. Orthopaedic Surgery Specializing in Arthroscopic and Reconstructive Surgery of the Shoulder [phone] [address] - [place], [place] [address] - Fax [phone]   POST-OP TOTAL SHOULDER REPLACEMENT INSTRUCTIONS  1. Follow up in the office for your first post-op appointment 10-14 days from the date of your surgery. If you do not already have a scheduled appointment, our office will contact you to schedule.  2. The bandage over your incision is waterproof. You may begin showering with this dressing on. You may leave this dressing on until first follow up appointment within 2 weeks. We prefer you leave this dressing in place until follow up however after 5-7 days if you are having itching or skin irritation and would like to remove it you may do so. Go slow and tug at the borders gently to break the bond the dressing has with the skin. At this point if there is no drainage it is okay to go without a bandage or you may cover it with a light guaze and tape. You can also expect significant bruising around your shoulder that will drift down your arm and into your chest wall. This is very normal and should resolve over several days.   3. Wear your sling/immobilizer at all times except to perform the exercises below or to occasionally let your arm dangle by your side to stretch your elbow. You also need to sleep in your sling immobilizer until instructed otherwise. It is ok to remove your sling if you are sitting in a controlled environment and allow your arm to rest in a position of comfort by your side or on your lap with pillows to give your neck and skin a break from the sling. You may remove it to allow arm to dangle by side to shower. If you are up walking around and when you go to sleep at night you need to wear it.  4. Range of motion to your elbow, wrist, and hand are encouraged 3-5 times daily. Exercise to your hand and fingers helps to reduce  swelling you may experience.   5. Prescriptions for a pain medication and a muscle relaxant are provided for you. It is recommended that if you are experiencing pain that you pain medication alone is not controlling, add the muscle relaxant along with the pain medication which can give additional pain relief. The first 1-2 days is generally the most severe of your pain and then should gradually decrease. As your pain lessens it is recommended that you decrease your use of the pain medications to an "as needed basis'" only and to always comply with the recommended dosages of the pain medications.  6. Pain medications can produce constipation along with their use. If you experience this, the use of an over the counter stool softener or laxative daily is recommended.   7. For additional questions or concerns, please do not hesitate to call the office. If after hours there is an answering service to forward your concerns to the physician on call.  8.Pain control following an exparel block  To help control your post-operative pain you received a nerve block  performed with Exparel which is a long acting anesthetic (numbing agent) which can provide pain relief and sensations of numbness (and relief of pain) in the operative shoulder and arm for up to 3 days. Sometimes it provides mixed relief, meaning you may still have numbness in certain areas of the arm but can still be able to   move  parts of that arm, hand, and fingers. We recommend that your prescribed pain medications  be used as needed. We do not feel it is necessary to "pre medicate" and "stay ahead" of pain.  Taking narcotic pain medications when you are not having any pain can lead to unnecessary and potentially dangerous side effects.    9. Use the ice machine as much as possible in the first 5-7 days from surgery, then you can wean its use to as needed. The ice typically needs to be replaced every 6 hours, instead of ice you can actually freeze  water bottles to put in the cooler and then fill water around them to avoid having to purchase ice. You can have spare water bottles freezing to allow you to rotate them once they have melted. Try to have a thin shirt or light cloth or towel under the ice wrap to protect your skin.   FOR ADDITIONAL INFO ON ICE MACHINE AND INSTRUCTIONS GO TO THE WEBSITE AT  https://www.[employer].com/products/donjoy/donjoy-iceman-[email or web address]  10.  We recommend that you avoid any dental work or cleaning in the first 3 months following your joint replacement. This is to help minimize the possibility of infection from the bacteria in your mouth that enters your bloodstream during dental work. We also recommend that you take an antibiotic prior to your dental work for the first year after your shoulder replacement to further help reduce that risk. Please simply contact our office for antibiotics to be sent to your pharmacy prior to dental work.  11. Dental Antibiotics:  In most cases prophylactic antibiotics for Dental procdeures after total joint surgery are not necessary.  Exceptions are as follows:  1. History of prior total joint infection  2. Severely immunocompromised (Organ Transplant, cancer chemotherapy, Rheumatoid biologic meds such as Humera)  3. Poorly controlled diabetes (A1C &gt; 8.0, blood glucose over 200)  If you have one of these conditions, contact your surgeon for an antibiotic prescription, prior to your dental procedure.   POST-OP EXERCISES  Pendulum Exercises  Perform pendulum exercises while standing and bending at the waist. Support your uninvolved arm on a table or chair and allow your operated arm to hang freely. Make sure to do these exercises passively - not using you shoulder muscles. These exercises can be performed once your nerve block effects have worn off.  Repeat 20 times. Do 3 sessions per day.     

## 2021-04-05 NOTE — Evaluation (Signed)
Occupational Therapy Evaluation Patient Details Name: [name] MRN: [id number] DOB: 01-27-1961 Today's Date: 04/05/2021    History of Present Illness Patient s/p L TSA   Clinical Impression   [name] is a 60 year old woman s/p shoulder replacement without functional use of left nondominant upper extremity secondary to effects of surgery and interscalene block and shoulder precautions. Therapist provided education and instruction to patient and spouse in regards to exercises, precautions, positioning, donning upper extremity clothing and bathing while maintaining shoulder precautions, ice and edema management and donning/doffing sling. Patient and spouse verbalized understanding and demonstrated as needed. Patient needed assistance to donn shirt, underwear, pants, socks and shoes and provided with instruction on compensatory strategies to perform ADLs. Patient to follow up with MD for further therapy needs.      Follow Up Recommendations       Equipment Recommendations  None recommended by OT    Recommendations for Other Services       Precautions / Restrictions Precautions Precautions: Shoulder Type of Shoulder Precautions: May come out of sling if sitting in controlled environment. ie while watching tv, eating etc to give neck and skin break from sling. Please sleep in sling though until 4 weeks post op.     Ok to use operative arm to assist in feeding, bathing, ADL's.       New PROM restrictions (8/18) for use in hygiene and ADL only   ER 20   ABD 45   FE 60     Pendulums are to be gentle and are the preferred exercise to be instructed for patients to perform at home.( Along with elbow wrist and hand exercise) Shoulder Interventions: Shoulder sling/immobilizer;At all times;Off for dressing/bathing/exercises Precaution Booklet Issued: Yes (comment) (handouts) Restrictions Weight Bearing Restrictions: Yes LUE Weight Bearing: Non weight bearing       Mobility Bed Mobility                    Transfers                 General transfer comment: Min guard for sit to stand for safety secondary to effects of anesthesia.    Balance Overall balance assessment: Independent                                         ADL either performed or assessed with clinical judgement   ADL                                         General ADL Comments: Needed assistance for upper body dressing, lower body dressing and donning sling. Patient's husband present for education and assisted as needed.     Vision Patient Visual Report: No change from baseline       Perception     Praxis      Pertinent Vitals/Pain Pain Assessment: No/denies pain (secondary to block)     Hand Dominance     Extremity/Trunk Assessment Upper Extremity Assessment Upper Extremity Assessment: LUE deficits/detail LUE Deficits / Details: impaired ROM/sensation secondary to interscalene block   Lower Extremity Assessment Lower Extremity Assessment: Overall WFL for tasks assessed   Cervical / Trunk Assessment Cervical / Trunk Assessment: Normal   Communication     Cognition Arousal/Alertness: Awake/alert Behavior  During Therapy: WFL for tasks assessed/performed Overall Cognitive Status: Within Functional Limits for tasks assessed                                     General Comments       Exercises     Shoulder Instructions Shoulder Instructions Donning/doffing shirt without moving shoulder: Caregiver independent with task Method for sponge bathing under operated UE: Caregiver independent with task Donning/doffing sling/immobilizer: Caregiver independent with task Correct positioning of sling/immobilizer: Caregiver independent with task Pendulum exercises (written home exercise program): Caregiver independent with task ROM for elbow, wrist and digits of operated UE: Independent Sling wearing  schedule (on at all times/off for ADL's): Independent Proper positioning of operated UE when showering: Independent Dressing change: Independent Positioning of UE while sleeping: Independent    Home Living Family/patient expects to be discharged to:: Private residence Living Arrangements: Spouse/significant other                                      Prior Functioning/Environment                   OT Problem List: Decreased strength;Decreased range of motion;Impaired UE functional use      OT Treatment/Interventions:      OT Goals(Current goals can be found in the care plan section) Acute Rehab OT Goals OT Goal Formulation: All assessment and education complete, DC therapy  OT Frequency:     Barriers to D/C:            Co-evaluation              AM-PAC OT "6 Clicks" Daily Activity     Outcome Measure Help from another person eating meals?: None Help from another person taking care of personal grooming?: None Help from another person toileting, which includes using toliet, bedpan, or urinal?: None Help from another person bathing (including washing, rinsing, drying)?: A Little Help from another person to put on and taking off regular upper body clothing?: A Lot Help from another person to put on and taking off regular lower body clothing?: A Little 6 Click Score: 20   End of Session Nurse Communication:  (OT education complete)  Activity Tolerance: Patient tolerated treatment well Patient left: in chair;with call bell/phone within reach;with family/visitor present  OT Visit Diagnosis: Pain                Time: 2585-2778 OT Time Calculation (min): 19 min Charges:  OT General Charges $OT Visit: 1 Visit OT Evaluation $OT Eval Low Complexity: 1 Low  Hatsue Sime, OTR/L [place]  Office [phone] Pager: [phone]   Lenward Chancellor 04/05/2021, 12:25 PM

## 2021-04-05 NOTE — Anesthesia Postprocedure Evaluation (Signed)
Anesthesia Post Note  Patient: [name]  Procedure(s) Performed: TOTAL SHOULDER ARTHROPLASTY (Left: Shoulder)     Patient location during evaluation: PACU Anesthesia Type: General Level of consciousness: sedated Pain management: pain level controlled Vital Signs Assessment: post-procedure vital signs reviewed and stable Respiratory status: spontaneous breathing and respiratory function stable Cardiovascular status: stable Postop Assessment: no apparent nausea or vomiting Anesthetic complications: no   No notable events documented.  Last Vitals:  Vitals:   04/05/21 1130 04/05/21 1200  BP: 136/72 134/76  Pulse: 73 73  Resp: 16 12  Temp:  36.5 C  SpO2: 94% 95%    Last Pain:  Vitals:   04/05/21 1200  TempSrc:   PainSc: 3                  Jonica Bickhart DANIEL

## 2021-04-05 NOTE — Anesthesia Procedure Notes (Signed)
Anesthesia Regional Block: Interscalene brachial plexus block   Pre-Anesthetic Checklist: , timeout performed,  Correct Patient, Correct Site, Correct Laterality,  Correct Procedure, Correct Position, site marked,  Risks and benefits discussed,  Surgical consent,  Pre-op evaluation,  At surgeon's request and post-op pain management  Laterality: Left  Prep: chloraprep       Needles:  Injection technique: Single-shot  Needle Type: Echogenic Stimulator Needle     Needle Length: 5cm  Needle Gauge: 22     Additional Needles:   Narrative:  Start time: 04/05/2021 7:08 AM End time: 04/05/2021 7:18 AM Injection made incrementally with aspirations every 5 mL.  Performed by: Personally  Anesthesiologist: Duane Boston, MD  Additional Notes: Functioning IV was confirmed and monitors applied.  A 58mm 22ga echogenic arrow stimulator was used. Sterile prep and drape,hand hygiene and sterile gloves were used.Ultrasound guidance: relevant anatomy identified, needle position confirmed, local anesthetic spread visualized around nerve(s)., vascular puncture avoided.  Image printed for medical record.  Negative aspiration and negative test dose prior to incremental administration of local anesthetic. The patient tolerated the procedure well.

## 2021-04-05 NOTE — Op Note (Signed)
04/05/2021  9:35 AM  PATIENT:   Janalyn Harder Fricker  60 y.o. female  PRE-OPERATIVE DIAGNOSIS:  left shoulder osteoarthritis  POST-OPERATIVE DIAGNOSIS: Same  PROCEDURE: Left shoulder stemless anatomic arthroplasty utilizing a size 39 trunnion with a medium cage screw 39 x 16 humeral head and a small glenoid  SURGEON:  Kalana Yust, Metta Clines M.D.  ASSISTANTS: Jenetta Loges, PA-C  ANESTHESIA:   General endotracheal and interscalene block with Exparel  EBL: 100 cc  SPECIMEN: None  Drains: None   PATIENT DISPOSITION:  PACU - hemodynamically stable.    PLAN OF CARE: Discharge to home after PACU  Brief history:  Patient is a 60 year old female with chronic and progressively increasing left shoulder pain related to severe osteoarthritis.  Due to her increasing functional imitations and failure to respond to prolonged attempts at conservative management she is brought to the operating at this time for planned left shoulder anatomic arthroplasty.  Preoperatively, I counseled the patient regarding treatment options and risks versus benefits thereof.  Possible surgical complications were all reviewed including potential for bleeding, infection, neurovascular injury, persistent pain, loss of motion, anesthetic complication, failure of the implant, and possible need for additional surgery. They understand and accept and agrees with our planned procedure.   Procedure in detail:  After undergoing routine preop evaluation the patient received prophylactic antibiotics and interscalene block with Exparel was established in the holding area by the anesthesia department.  Patient subsequently brought to the operating room placed spine on the operative table and underwent the smooth induction of a general endotracheal anesthesia.  Placed in the beachchair position and appropriately padded and protected.  The left shoulder girdle region was sterilely prepped and draped in standard fashion.  Timeout was  called.  A deltopectoral approach left shoulder is made through an 8 cm incision.  Skin flaps elevated dissection carried deeply and the deltopectoral interval was developed from proximal to distal with the vein taken laterally.  Upper centimeter the pectoralis major was tenotomized for exposure and the conjoined tendon was mobilized and retracted medially.  The long head biceps tendon was then tenodesed at the upper border the pectoralis major tendon with proximal segment unroofed and excised.  The rotator cuff was then split along the rotator interval to the base of the coracoid from the apex of the bicipital groove and the insertion of the subscapularis was then carefully identified superiorly and inferiorly.  An oscillating saw was then used to perform a lesser tuberosity osteotomy and the subscapularis was then mobilized tagged and reflected medially and the capsular attachments were then divided from the anterior and inferior margins of the humeral neck allowing deliver the humeral head through the wound.  We used our extramedullary guide to outline a proposed humeral head resection which was performed at approximate 25 degrees of retroversion using an oscillating saw with care taken to protect the rotator cuff superiorly and posteriorly.  Peripheral osteophytes were then removed from margin of the humeral neck and the humeral metaphysis was then sized and a 39 with preparation performed and metal cap then placed over the cut proximal humeral surface.  We then exposed the glenoid with appropriate retractors and a circumferential labral resection was completed.  Glenoid preparation was then completed with the central guidepin being introduced and the glenoid was then reamed to a stable subchondral bony bed and then preparation completed with the central drill followed by the superior and inferior peg and slot respectively.  Trial was then placed with excellent fit and  fixation.  At this point cement was then  mixed.  The glenoid was meticulously cleaned and dried.  Cement was introduced into the superior and inferior peg and slot respectively.  The glenoid was then impacted after bone graft was impacted about the central peg and the glenoid was then seated with excellent fit and fixation.  We then returned our attention to the humeral metaphysis where our size 39 trunnion was placed with a suture tape suture through the eyelid on the anterior margin of the trunnion and the central cage screw was then placed size medium packed with bone and inserted with excellent purchase and fixation.  We then performed a series of trial reductions and ultimately felt that the 39 x 16 humeral head gave [place] the best motion good stability good soft tissue balance.  The trial was then removed we placed 2 additional medial row suture anchors for repair of the LTO.  This point the [facility] taper was cleaned and dried and the final 39 x 16 head was impacted in the final reduction was performed again showing approximate 50% translation of the humeral head of the glenoid and the overall excellent motion and stability and soft tissue balance.  We then confirmed good elasticity of the subscapularis.  Our 3 medial row sutures were then placed to the bone tendon junction of the lesser tuberosity osteotomy and then in an alternating fashion placed into 2 lateral anchors placed in the bicipital groove and this allowed excellent apposition and stability of the LTO repair.  The rotator interval was repaired with a series of figure-of-eight suture tape sutures.  At this point the arm easily achieved 45 degrees of external rotation without excessive tension on the subscapularis.  Final inspection and irrigation was then completed.  Hemostasis was obtained.  A gram of vancomycin powder was then spread liberally throughout the deep soft tissue planes.  The deltopectoral interval was reapproximated with a series of figure-of-eight number Vicryl suture.  2-0  Monocryl used to the subcu layer and intracuticular 3-0 Monocryl used for the skin followed by Dermabond and Aquacel dressing.  Left arm placed in a sling.  Patient was awakened, extubated, and taken to the recovery room in stable condition.  Jenetta Loges, PA-C was utilized as an Environmental consultant throughout this case, essential for help with positioning the patient, positioning extremity, tissue manipulation, implantation of the prosthesis, suture management, wound closure, and intraoperative decision-making.  Marin Shutter MD   Contact # [phone]

## 2021-04-05 NOTE — Transfer of Care (Signed)
Immediate Anesthesia Transfer of Care Note  Patient: [name]  Procedure(s) Performed: TOTAL SHOULDER ARTHROPLASTY (Left: Shoulder)  Patient Location: PACU  Anesthesia Type:GA combined with regional for post-op pain  Level of Consciousness: awake and patient cooperative  Airway & Oxygen Therapy: Patient Spontanous Breathing and Patient connected to face mask  Post-op Assessment: Report given to RN and Post -op Vital signs reviewed and stable  Post vital signs: Reviewed and stable  Last Vitals:  Vitals Value Taken Time  BP 143/81 04/05/21 0946  Temp    Pulse 84 04/05/21 0948  Resp    SpO2 100 % 04/05/21 0948  Vitals shown include unvalidated device data.  Last Pain:  Vitals:   04/05/21 0607  TempSrc:   PainSc: 2       Patients Stated Pain Goal: 2 (83/77/93 9688)  Complications: No notable events documented.

## 2021-04-05 NOTE — Anesthesia Procedure Notes (Signed)
Procedure Name: Intubation Date/Time: 04/05/2021 7:43 AM Performed by: Claudia Desanctis, CRNA Pre-anesthesia Checklist: Patient identified, Emergency Drugs available, Suction available and Patient being monitored Patient Re-evaluated:Patient Re-evaluated prior to induction Oxygen Delivery Method: Circle system utilized Preoxygenation: Pre-oxygenation with 100% oxygen Induction Type: IV induction Ventilation: Mask ventilation without difficulty Laryngoscope Size: 2 and Miller Grade View: Grade II Tube type: Oral Tube size: 7.0 mm Number of attempts: 1 Airway Equipment and Method: Stylet Placement Confirmation: ETT inserted through vocal cords under direct vision, positive ETCO2 and breath sounds checked- equal and bilateral Secured at: 21 cm Tube secured with: Tape Dental Injury: Teeth and Oropharynx as per pre-operative assessment

## 2021-04-06 ENCOUNTER — Encounter (HOSPITAL_COMMUNITY): Payer: Self-pay | Admitting: Orthopedic Surgery

## 2021-04-08 ENCOUNTER — Encounter (HOSPITAL_BASED_OUTPATIENT_CLINIC_OR_DEPARTMENT_OTHER): Payer: Self-pay | Admitting: Obstetrics and Gynecology

## 2021-04-08 ENCOUNTER — Emergency Department (HOSPITAL_BASED_OUTPATIENT_CLINIC_OR_DEPARTMENT_OTHER): Payer: Commercial Managed Care - PPO

## 2021-04-08 ENCOUNTER — Emergency Department (HOSPITAL_BASED_OUTPATIENT_CLINIC_OR_DEPARTMENT_OTHER)
Admission: EM | Admit: 2021-04-08 | Discharge: 2021-04-08 | Disposition: A | Discharge status: Home / Self Care | Payer: Commercial Managed Care - PPO | Attending: Emergency Medicine | Admitting: Emergency Medicine

## 2021-04-08 ENCOUNTER — Other Ambulatory Visit: Payer: Self-pay

## 2021-04-08 DIAGNOSIS — S2002XA Contusion of left breast, initial encounter: Secondary | ICD-10-CM | POA: Insufficient documentation

## 2021-04-08 DIAGNOSIS — S40022A Contusion of left upper arm, initial encounter: Secondary | ICD-10-CM | POA: Insufficient documentation

## 2021-04-08 DIAGNOSIS — Z96643 Presence of artificial hip joint, bilateral: Secondary | ICD-10-CM | POA: Insufficient documentation

## 2021-04-08 DIAGNOSIS — X58XXXA Exposure to other specified factors, initial encounter: Secondary | ICD-10-CM | POA: Diagnosis not present

## 2021-04-08 DIAGNOSIS — T8189XA Other complications of procedures, not elsewhere classified, initial encounter: Secondary | ICD-10-CM | POA: Diagnosis present

## 2021-04-08 DIAGNOSIS — Z86011 Personal history of benign neoplasm of the brain: Secondary | ICD-10-CM | POA: Diagnosis not present

## 2021-04-08 DIAGNOSIS — Z96612 Presence of left artificial shoulder joint: Secondary | ICD-10-CM | POA: Diagnosis not present

## 2021-04-08 NOTE — Discharge Instructions (Addendum)
You were seen today for postop skin changes.  Your exam is most consistent with bruising likely related to positioning during surgery.  Follow-up with Dr. Onnie Graham.  If you notice any migrating redness, fevers, any new or worsening symptoms, you should be reevaluated.

## 2021-04-08 NOTE — ED Provider Notes (Signed)
[facility] EMERGENCY DEPT Provider Note   CSN: [id number] Arrival date & time: 04/08/21  2205     History Chief Complaint  Patient presents with   Post-op Problem    [name] is a 60 y.o. female.  HPI     This is a 60 year old female who presents with postop skin changes.  Patient reports that she had a total left shoulder done on Thursday.  She has been doing well at home.  She reports minimal pain when not moving.  She states that she has noticed increased redness and swelling of the left arm and left breast.  There is some pain associated.  She feels like it may be warm.  No fevers.  No numbness or tingling of the hands.  She is mostly concerned about the skin changes.  She is not on any anticoagulants or antiplatelet medications.  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[place]) 07/28/2013   Hyponatremia 05/07/2012   Dehydration 05/07/2012   Nausea & vomiting 05/07/2012   UTI (lower urinary tract infection) 05/07/2012   HTN (hypertension) 04/29/2012   Pituitary mass ([place]) 04/29/2012   Acromegaly ([place]) 04/01/2012   Pituitary adenoma-resected 2013 04/01/2012   Obstructive sleep apnea 04/01/2012    Past Surgical History:  Procedure Laterality Date   appendectomy  04/2008   bladder suspension, unspecified  yrs ago   burnionectomy, bilateral     colonscopy  match 2019   endoscopy  also   elbow ligament surgery right remote     HEMORRHOID SURGERY  06/2007   mesh removal due to an abcess      Bladder.   nerve decompression right c5 and nerve ablation c5 and c6     PITUITARY SURGERY  2013   UVa/ Pituitary adenoma w/ acromegaly   rotator cuff repair right shoulder  11/2010   TOTAL HIP ARTHROPLASTY Right 10/23/2016   Procedure: RIGHT TOTAL HIP ARTHROPLASTY ANTERIOR APPROACH;  Surgeon: Gaynelle Arabian, MD;  Location: [facility];  Service: Orthopedics;  Laterality: Right;   TOTAL HIP ARTHROPLASTY Left 01/28/2018   Procedure: LEFT TOTAL HIP ARTHROPLASTY ANTERIOR APPROACH;  Surgeon: Gaynelle Arabian, MD;  Location: [facility];  Service: Orthopedics;  Laterality: Left;   TOTAL SHOULDER ARTHROPLASTY Left 04/05/2021   Procedure: TOTAL SHOULDER ARTHROPLASTY;  Surgeon: Justice Britain, MD;  Location: [facility];  Service: Orthopedics;  Laterality: Left;  115min     OB History     Gravida      Para      Term      Preterm      AB      Living  1      SAB      IAB      Ectopic      Multiple      Live Births  Family History  Problem Relation Age of Onset   Heart disease Father    Coronary artery disease Father    Heart disease Mother    Coronary artery disease Mother    Sleep apnea Sister     Social History   Tobacco Use   Smoking status: Never    Passive exposure: Never   Smokeless tobacco: Never  Vaping Use   Vaping Use: Never used  Substance Use Topics   Alcohol use: No   Drug use: No    Home Medications Prior to Admission medications   Medication Sig Start Date End Date Taking? Authorizing Provider  acetaminophen (TYLENOL) 500 MG tablet Take 1,000 mg by mouth daily as needed for moderate pain or headache.    [provider]  Camphor-Menthol-Methyl Sal (SALONPAS DEEP RELIEVING) 3.10-16-13 % GEL Apply 1 application topically daily as needed (pain).    [provider]  conjugated estrogens (PREMARIN) vaginal cream Place 1  Applicatorful vaginally daily as needed (dryness).    [provider]  cyclobenzaprine (FLEXERIL) 10 MG tablet Take 1 tablet (10 mg total) by mouth 3 (three) times daily as needed for muscle spasms. 04/05/21   Shuford, Olivia Mackie, PA-C  Diclofenac Sodium 2 % SOLN Apply 1 Pump topically daily as needed (pain). 02/15/20   [provider]  Magnesium 500 MG CAPS Take 500 mg by mouth daily.     [provider]  meloxicam (MOBIC) 15 MG tablet Take 1 tablet (15 mg total) by mouth daily as needed for pain. 04/05/21   Shuford, Olivia Mackie, PA-C  Omega-3 Fatty Acids (FISH OIL ULTRA) 1400 MG CAPS Take 2,800 mg by mouth daily.    [provider]  ondansetron (ZOFRAN) 4 MG tablet Take 1 tablet (4 mg total) by mouth every 8 (eight) hours as needed for nausea or vomiting. 04/05/21   Shuford, Olivia Mackie, PA-C  oxyCODONE-acetaminophen (PERCOCET) 5-325 MG tablet Take 1 tablet by mouth every 4 (four) hours as needed (max 6 q). 04/05/21   Shuford, Olivia Mackie, PA-C    Allergies    Epinephrine, Codeine, and Xarelto [rivaroxaban]  Review of Systems   Review of Systems  Constitutional:  Negative for fever.  Respiratory:  Negative for shortness of breath.   Cardiovascular:  Negative for chest pain.  Gastrointestinal:  Negative for abdominal pain.  Skin:  Positive for color change.  All other systems reviewed and are negative.  Physical Exam Updated Vital Signs BP (!) 143/72   Pulse 78   Temp 98.4 F (36.9 C)   Resp 18   Ht 1.575 m (5\' 2" )   Wt 78 kg   SpO2 98%   BMI 31.45 kg/m   Physical Exam Vitals and nursing note reviewed.  Constitutional:      Appearance: She is well-developed. She is not ill-appearing.  HENT:     Head: Normocephalic and atraumatic.     Nose: Nose normal.     Mouth/Throat:     Mouth: Mucous membranes are moist.  Eyes:     Pupils: Pupils are equal, round, and reactive to light.  Cardiovascular:     Rate and Rhythm: Normal rate and regular rhythm.  Pulmonary:      Effort: Pulmonary effort is normal. No respiratory distress.  Abdominal:     Palpations: Abdomen is soft.  Musculoskeletal:     Comments: Surgical dressing over the anterior left shoulder, no adjacent erythema, 2+ radial pulse and neurovascular intact distal left upper extremity  Skin:    General: Skin  is warm and dry.     Comments: Nonblanching erythema bruising noted mostly over the medial aspect of the left upper arm just distal to the surgical site, no significant warmth, additionally there is bruising noted over the inferior aspect of the left breast, tender to palpation, nonfluctuant  Neurological:     Mental Status: She is alert and oriented to person, place, and time.  Psychiatric:        Mood and Affect: Mood normal.       ED Results / Procedures / Treatments   Labs (all labs ordered are listed, but only abnormal results are displayed) Labs Reviewed - No data to display  EKG None  Radiology DG Shoulder Left  Result Date: 04/08/2021 CLINICAL DATA:  Left shoulder pain. Recent shoulder replacement. Bruising and swelling. EXAM: LEFT SHOULDER - 2+ VIEW COMPARISON:  None. FINDINGS: Glenohumeral arthroplasty. No dislocation. No periprosthetic fracture. There is vague decreased density in the lateral humeral head, of unknown acuity. Acromioclavicular joint is congruent. IMPRESSION: 1. Glenohumeral arthroplasty. 2. Vague decreased density in the lateral humeral head, of unknown acuity. This may be pre-existing decreased bone density or developing periprosthetic lucency. No preoperative exams are available. Electronically Signed   By: Keith Rake M.D.   On: 04/08/2021 23:06    Procedures Procedures   Medications Ordered in ED Medications - No data to display  ED Course  I have reviewed the triage vital signs and the nursing notes.  Pertinent labs & imaging results that were available during my care of the patient were reviewed by me and considered in my medical decision  making (see chart for details).    MDM Rules/Calculators/A&P                            Patient presents with concerns for skin changes distal to her left shoulder surgery site.  She is nontoxic and vital signs are reassuring.  Physical exam is most consistent with skin trauma likely related to positioning during surgery.  Patient does report that her left arm was strapped during surgery but she is not sure where.  I do not see any obvious signs of infection.  Not clinically consistent with blood clots.  X-ray of the left shoulder did not show any significant acute findings.  This was individually reviewed by myself.  Patient was reassured.  She has an ice machine at home that she states she has been using.  Recommend she continue to apply ice.  I will message Dr. Onnie Graham.  She was encouraged to call office on Tuesday for follow-up.  If she has spreading redness, increasing warmth, any new or worsening symptoms, she should be reevaluated.  After history, exam, and medical workup I feel the patient has been appropriately medically screened and is safe for discharge home. Pertinent diagnoses were discussed with the patient. Patient was given return precautions.  Final Clinical Impression(s) / ED Diagnoses Final diagnoses:  Contusion of left breast, initial encounter  Contusion of left upper extremity, initial encounter    Rx / DC Orders ED Discharge Orders     None        Candice Lunney, Barbette Hair, MD 04/08/21 [phone]

## 2021-04-08 NOTE — ED Triage Notes (Signed)
Patient reports she recently had a total shoulder replacement and has since developed large amounts of bruising and swelling., Patient has an area of concern for possible fluid or infection that is red and warm to touch

## 2021-11-23 ENCOUNTER — Other Ambulatory Visit: Payer: Self-pay | Admitting: Orthopedic Surgery

## 2021-11-23 ENCOUNTER — Other Ambulatory Visit (HOSPITAL_COMMUNITY): Payer: Self-pay | Admitting: Orthopedic Surgery

## 2021-11-23 DIAGNOSIS — Z96612 Presence of left artificial shoulder joint: Secondary | ICD-10-CM

## 2021-12-03 ENCOUNTER — Ambulatory Visit (HOSPITAL_COMMUNITY)
Admission: RE | Admit: 2021-12-03 | Discharge: 2021-12-03 | Disposition: A | Discharge status: Home / Self Care | Payer: Commercial Managed Care - PPO | Source: Ambulatory Visit | Attending: Orthopedic Surgery | Admitting: Orthopedic Surgery

## 2021-12-03 ENCOUNTER — Other Ambulatory Visit: Payer: Self-pay

## 2021-12-03 ENCOUNTER — Encounter (INDEPENDENT_AMBULATORY_CARE_PROVIDER_SITE_OTHER): Payer: Self-pay

## 2021-12-03 DIAGNOSIS — Z96612 Presence of left artificial shoulder joint: Secondary | ICD-10-CM | POA: Insufficient documentation

## 2022-06-23 IMAGING — NM NM BONE 3 PHASE
4 series · 4 of 4 positions shown · non-contrast
Comparison: 04/08/2021

CLINICAL DATA: Left shoulder arthroplasty March 2021, persistent
pain

EXAM:
NUCLEAR MEDICINE 3-PHASE BONE SCAN
TECHNIQUE: Radionuclide angiographic images, immediate static blood pool
images, and 3-hour delayed static images were obtained of the
bilateral shoulders after intravenous injection of
radiopharmaceutical.
RADIOPHARMACEUTICALS:  20.5 mCi [id number]-ZZm MDP IV

[Series 1: delay · delayed · 2.07mm/px · 1 of 1 slices shown (1 of 4)]
[im 1/1  full-range]
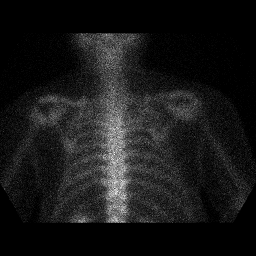

[Series 1: delay · delayed · 2.07mm/px · 1 of 1 slices shown (2 of 4)]
[im 1/1]
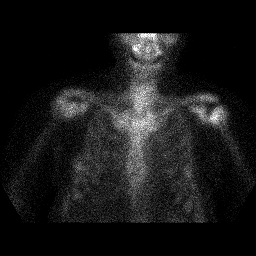

[Series 2: delay · delayed · 2.07mm/px · 1 of 1 slices shown (3 of 4)]
[im 1/1  full-range]
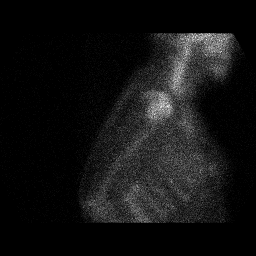

[Series 2: delay · delayed · 2.07mm/px · 1 of 1 slices shown (4 of 4)]
[im 1/1  full-range]
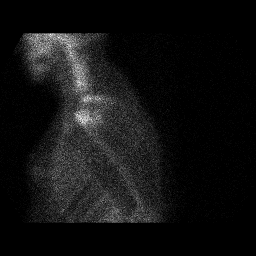

[4 of 4 positions shown; findings below may reference images not displayed]

FINDINGS: Vascular phase: Normal distribution of radiotracer during the
vascular phase of the exam. No focal abnormal uptake.

Blood pool phase: Normal physiologic distribution of radiotracer
identified within the heart, liver, spleen, and visualized portions
of the kidneys. No abnormal radiotracer uptake surrounding either
shoulder.

Delayed phase: Delayed images demonstrate photopenia related to left
shoulder arthroplasty. Mild low level radiotracer uptake within the
humerus adjacent to of her plasty consistent with postsurgical
change. No other focal abnormal radiotracer uptake identified.
IMPRESSION: 1. Likely postsurgical uptake along the left shoulder arthroplasty
as above. No abnormal radiotracer deposition to suggest infection or
loosening.
